# Patient Record
Sex: Female | Born: 1990 | Race: White | Hispanic: No | Smoking: Former smoker
Health system: Southern US, Community
[De-identification: ages and names within clinical notes are randomized; demographics above are authoritative.]

## PROBLEM LIST (undated history)

## (undated) DIAGNOSIS — E039 Hypothyroidism, unspecified: Secondary | ICD-10-CM

## (undated) HISTORY — PX: WISDOM TOOTH EXTRACTION: SHX21

---
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
## 2014-10-06 ENCOUNTER — Ambulatory Visit: Payer: Self-pay | Admitting: Physician Assistant

## 2014-10-06 LAB — TSH: Thyroid Stimulating Horm: 7.88 u[IU]/mL — ABNORMAL HIGH

## 2015-06-20 ENCOUNTER — Ambulatory Visit
Admission: EM | Admit: 2015-06-20 | Discharge: 2015-06-20 | Disposition: A | Discharge status: Home / Self Care | Payer: Self-pay | Attending: Family Medicine | Admitting: Family Medicine

## 2015-06-20 ENCOUNTER — Encounter: Payer: Self-pay | Admitting: Emergency Medicine

## 2015-06-20 DIAGNOSIS — B070 Plantar wart: Secondary | ICD-10-CM

## 2015-06-20 MED ORDER — NAPROXEN 500 MG PO TABS: 500.0000 mg | ORAL_TABLET | Freq: Two times a day (BID) | ORAL | Status: DC

## 2015-06-20 NOTE — Discharge Instructions (Signed)
Plantar Warts Warts are benign (noncancerous) growths of the outer skin layer. They can occur at any time in life but are most common during childhood and the teen years. Warts can occur on many skin surfaces of the body. When they occur on the underside (sole) of your foot they are called plantar warts. They often emerge in groups with several small warts encircling a larger growth. CAUSES  Human papillomavirus (HPV) is the cause of plantar warts. HPV attacks a break in the skin of the foot. Walking barefoot can lead to exposure to the wart virus. Plantar warts tend to develop over areas of pressure such as the heel and ball of the foot. Plantar warts often grow into the deeper layers of skin. They may spread to other areas of the sole but cannot spread to other areas of the body. SYMPTOMS  You may also notice a growth on the undersurface of your foot. The wart may grow directly into the sole of the foot, or rise above the surface of the skin on the sole of the foot, or both. They are most often flat from pressure. Warts generally do not cause itching but may cause pain in the area of the wart when you put weight on your foot. DIAGNOSIS  Diagnosis is made by physical examination. This means your caregiver discovers it while examining your foot.  TREATMENT  There are many ways to treat plantar warts. However, warts are very tough. Sometimes it is difficult to treat them so that they go away completely and do not grow back. Any treatment must be done regularly to work. If left untreated, most plantar warts will eventually disappear over a period of one to two years. Treatments you can do at home include:  Putting duct tape over the top of the wart (occlusion) has been found to be effective over several months. The duct tape should be removed each night and reapplied until the wart has disappeared.  Placing over-the-counter medications on top of the wart to help kill the wart virus and remove the wart  tissue (salicylic acid, cantharidin, and dichloroacetic acid) are useful. These are called keratolytic agents. These medications make the skin soft and gradually layers will shed away. These compounds are usually placed on the wart each night and then covered with a bandage. They are also available in premedicated bandage form. Avoid surrounding skin when applying these liquids as these medications can burn healthy skin. The treatment may take several months of nightly use to be effective.  Cryotherapy to freeze the wart has recently become available over-the-counter for children 4 years and older. This system makes use of a soft narrow applicator connected to a bottle of compressed cold liquid that is applied directly to the wart. This medication can burn healthy skin and should be used with caution.  As with all over-the-counter medications, read the directions carefully before use. Treatments generally done in your caregiver's office include:  Some aggressive treatments may cause discomfort, discoloration, and scarring of the surrounding skin. The risks and benefits of treatment should be discussed with your caregiver.  Freezing the wart with liquid nitrogen (cryotherapy, see above).  Burning the wart with use of very high heat (cautery).  Injecting medication into the wart.  Surgically removing or laser treatment of the wart.  Your caregiver may refer you to a dermatologist for difficult to treat large-sized warts or large numbers of warts. HOME CARE INSTRUCTIONS   Soak the affected area in warm water. Dry the   area completely when you are done. Remove the top layer of softened skin, then apply the chosen topical medication and reapply a bandage.  Remove the bandage daily and file excess wart tissue (pumice stone works well for this purpose). Repeat the entire process daily or every other day for weeks until the plantar wart disappears.  Several brands of salicylic acid pads are available  as over-the-counter remedies.  Pain can be relieved by wearing a donut bandage. This is a bandage with a hole in it. The bandage is put on with the hole over the wart. This helps take the pressure off the wart and gives pain relief. To help prevent plantar warts:  Wear shoes and socks and change them daily.  Keep feet clean and dry.  Check your feet and your children's feet regularly.  Avoid direct contact with warts on other people.  Have growths or changes on your skin checked by your caregiver. Document Released: 02/21/2004 Document Revised: 04/17/2014 Document Reviewed: 08/01/2009 ExitCare Patient Information 2015 [employer]. This information is not intended to replace advice given to you by your health care provider. Make sure you discuss any questions you have with your health care provider.  

## 2015-06-20 NOTE — ED Provider Notes (Signed)
CSN: [id number]     Arrival date & time 06/20/15  1232 History   First MD Initiated Contact with Patient 06/20/15 1347     Chief Complaint  Patient presents with  . Foot Pain   (Consider location/radiation/quality/duration/timing/severity/associated sxs/prior Treatment) HPI is a 24 year old female who presents with left foot pain from a callus type lesion at the base of her left second toe. It is not overlying the metatarsal head. She states has been there for approximate 4 months is not improved. Now it is very painful when  she bears weight. 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 She states that she has no insurance.  History reviewed. No pertinent past medical history.   History reviewed. No pertinent past surgical history. History reviewed. No pertinent family history. History  Substance Use Topics  . Smoking status: Current Every Day Smoker -- 0.50 packs/day    Types: Cigarettes  . Smokeless tobacco: Not on file  . Alcohol Use: Yes   OB History    No data available     Review of Systems  All other systems reviewed and are negative.   Allergies  Review of patient's allergies indicates no known allergies.  Home Medications   Prior to Admission medications   Medication Sig Start Date End Date Taking? Authorizing Provider  naproxen (NAPROSYN) 500 MG tablet Take 1 tablet (500 mg total) by mouth 2 (two) times daily with a meal. 06/20/15   Lutricia FeilWilliam P Roemer, PA-C   BP 121/62 mmHg  Pulse 66  Temp(Src) 98.1 F (36.7 C) (Oral)  Resp 16  SpO2 100%  LMP 05/16/2015 Physical Exam  Constitutional: She is oriented to person, place, and time. She appears well-developed and well-nourished.  HENT:  Head: Normocephalic and atraumatic.  Musculoskeletal:  Examination of her left foot shows a hyperkeratotic round area measuring approximately 1 cm in diameter with a central core that is very painful with any pressure. This is located at the base of the  second toe not on the metatarsal head. It is in the midline of the toe. There is no evidence of infection.  Neurological: She is alert and oriented to person, place, and time.  Skin: Skin is warm and dry.  Psychiatric: She has a normal mood and affect. Her behavior is normal. Judgment and thought content normal.    ED Course  Procedures (including critical care time) Labs Review Labs Reviewed - No data to display  Imaging Review No results found.   MDM   1. Plantar wart of left foot    New Prescriptions   NAPROXEN (NAPROSYN) 500 MG TABLET    Take 1 tablet (500 mg total) by mouth 2 (two) times daily with a meal.  Plan: 1. Diagnosis reviewed with patient 2. rx as per orders; risks, benefits, potential side effects reviewed with patient 3. Recommend supportive treatment with metatarsal pads 4. F/u podiatrist for eval and treatment   Lutricia FeilWilliam P Roemer, PA-C 06/20/15 1431

## 2015-06-20 NOTE — ED Notes (Signed)
Pt states that she has a callus on the bottom of her foot that is causing her pain it has been there for 4 months and she states that it is now getting to where she can barely place pressure or walk on her foot

## 2015-12-09 ENCOUNTER — Emergency Department: Payer: Self-pay

## 2015-12-09 ENCOUNTER — Emergency Department
Admission: EM | Admit: 2015-12-09 | Discharge: 2015-12-09 | Discharge status: Against Medical Advice | Payer: Self-pay | Attending: Emergency Medicine | Admitting: Emergency Medicine

## 2015-12-09 ENCOUNTER — Encounter: Payer: Self-pay | Admitting: Emergency Medicine

## 2015-12-09 DIAGNOSIS — R2242 Localized swelling, mass and lump, left lower limb: Secondary | ICD-10-CM | POA: Insufficient documentation

## 2015-12-09 DIAGNOSIS — M79672 Pain in left foot: Secondary | ICD-10-CM | POA: Insufficient documentation

## 2015-12-09 DIAGNOSIS — F17210 Nicotine dependence, cigarettes, uncomplicated: Secondary | ICD-10-CM | POA: Insufficient documentation

## 2015-12-09 HISTORY — DX: Hypothyroidism, unspecified: E03.9

## 2015-12-09 NOTE — ED Notes (Signed)
Pt presents to ED with left foot swelling and pain that is described as achy. onset approx 4 days ago. Denies any known injury. swelling present with no obvious redness, rash, or bruising. Pt states swelling as increased slightly since onset. Painful when palpating top of foot. Pt is alert and calm at this time and denies any other symptoms. Foot slightly discolored; +cap refill and +pedal pulses.

## 2016-04-29 ENCOUNTER — Encounter: Payer: Self-pay | Admitting: Emergency Medicine

## 2016-04-29 ENCOUNTER — Emergency Department: Payer: Self-pay

## 2016-04-29 ENCOUNTER — Emergency Department
Admission: EM | Admit: 2016-04-29 | Discharge: 2016-04-29 | Disposition: A | Discharge status: Home / Self Care | Payer: Self-pay | Attending: Emergency Medicine | Admitting: Emergency Medicine

## 2016-04-29 DIAGNOSIS — J069 Acute upper respiratory infection, unspecified: Secondary | ICD-10-CM | POA: Insufficient documentation

## 2016-04-29 DIAGNOSIS — F17210 Nicotine dependence, cigarettes, uncomplicated: Secondary | ICD-10-CM | POA: Insufficient documentation

## 2016-04-29 DIAGNOSIS — Z791 Long term (current) use of non-steroidal anti-inflammatories (NSAID): Secondary | ICD-10-CM | POA: Insufficient documentation

## 2016-04-29 DIAGNOSIS — E039 Hypothyroidism, unspecified: Secondary | ICD-10-CM | POA: Insufficient documentation

## 2016-04-29 MED ORDER — GUAIFENESIN-CODEINE 100-10 MG/5ML PO SOLN: 10.0000 mL | ORAL | Status: DC | PRN

## 2016-04-29 MED ORDER — AZITHROMYCIN 250 MG PO TABS: ORAL_TABLET | ORAL | Status: DC

## 2016-04-29 NOTE — ED Notes (Signed)
Pt to ed with c/o cough since feb.  Pt states coughing up brown sputum.   Pt +smoker.  Pt currently living in a tent.

## 2016-04-29 NOTE — ED Provider Notes (Signed)
[facility] Emergency Department Provider Note  ____________________________________________  Time seen: Approximately 11:38 AM  I have reviewed the triage vital signs and the nursing notes.   HISTORY  Chief Complaint Cough    HPI Kristen Knight is a 25 y.o. female who presents for evaluation of coughing since February. Patient states now she is coughing up brown reddish sputum. Patient is positive smoker and lives in a tent. Patient denies any fever chills nausea or vomiting.   Past Medical History  Diagnosis Date  . Hypothyroid     There are no active problems to display for this patient.   History reviewed. No pertinent past surgical history.  Current Outpatient Rx  Name  Route  Sig  Dispense  Refill  . azithromycin (ZITHROMAX Z-PAK) 250 MG tablet      Take 2 tablets (500 mg) on  Day 1,  followed by 1 tablet (250 mg) once daily on Days 2 through 5.   6 each   0   . guaiFENesin-codeine 100-10 MG/5ML syrup   Oral   Take 10 mLs by mouth every 4 (four) hours as needed for cough.   180 mL   0   . naproxen (NAPROSYN) 500 MG tablet   Oral   Take 1 tablet (500 mg total) by mouth 2 (two) times daily with a meal.   60 tablet   0     Allergies Review of patient's allergies indicates no known allergies.  History reviewed. No pertinent family history.  Social History Social History  Substance Use Topics  . Smoking status: Current Every Day Smoker -- 0.50 packs/day    Types: Cigarettes  . Smokeless tobacco: None  . Alcohol Use: Yes    Review of Systems Constitutional: No fever/chills Eyes: No visual changes. ENT: No sore throat. Cardiovascular: Denies chest pain. Respiratory: Denies shortness of breath.Positive for coughing up blood. Gastrointestinal: No abdominal pain.  No nausea, no vomiting.  No diarrhea.  No constipation. Genitourinary: Negative for dysuria. Musculoskeletal: Negative for back pain. Skin: Negative for  rash. Neurological: Negative for headaches, focal weakness or numbness.  10-point ROS otherwise negative.  ____________________________________________   PHYSICAL EXAM:  VITAL SIGNS: ED Triage Vitals  Enc Vitals Group     BP 04/29/16 1111 117/61 mmHg     Pulse Rate 04/29/16 1111 80     Resp 04/29/16 1111 20     Temp 04/29/16 1111 98.2 F (36.8 C)     Temp Source 04/29/16 1111 Oral     SpO2 04/29/16 1111 98 %     Weight 04/29/16 1111 260 lb (117.935 kg)     Height 04/29/16 1111  (1.651 m)     Head Cir --      Peak Flow --      Pain Score 04/29/16 1123 0     Pain Loc --      Pain Edu? --      Excl. in GC? --     Constitutional: Alert and oriented. Well appearing and in no acute distress. Head: Atraumatic. Nose: No congestion/rhinnorhea. Mouth/Throat: Mucous membranes are moist.  Oropharynx non-erythematous. Neck: No stridor.   Cardiovascular: Normal rate, regular rhythm. Grossly normal heart sounds.  Good peripheral circulation. Respiratory: Normal respiratory effort.  No retractions. Lungs CTAB. Musculoskeletal: No lower extremity tenderness nor edema.  No joint effusions. Neurologic:  Normal speech and language. No gross focal neurologic deficits are appreciated. No gait instability. Skin:  Skin is warm, dry and intact. No rash  noted. Psychiatric: Mood and affect are normal. Speech and behavior are normal.  ____________________________________________   LABS (all labs ordered are listed, but only abnormal results are displayed)  Labs Reviewed - No data to display ____________________________________________   RADIOLOGY  No acute cardiopulmonary findings. ____________________________________________   PROCEDURES  Procedure(s) performed: None  Critical Care performed: No  ____________________________________________   INITIAL IMPRESSION / ASSESSMENT AND PLAN / ED COURSE  Pertinent labs & imaging results that were available during my care of the  patient were reviewed by me and considered in my medical decision making (see chart for details).  Acute URI. Rx given for Zithromax and Robitussin with codeine. Patient follow-up PCP or return to ER with any worsening symptomology. Patient voices no other emergency medical complaints at this visit. ____________________________________________   FINAL CLINICAL IMPRESSION(S) / ED DIAGNOSES  Final diagnoses:  URI, acute     This chart was dictated using voice recognition software/Dragon. Despite best efforts to proofread, errors can occur which can change the meaning. Any change was purely unintentional.   Evangeline Dakinharles M Tytus Strahle, PA-C 04/29/16 1506  Sharman CheekPhillip Stafford, MD 04/30/16 [phone]

## 2016-04-29 NOTE — Discharge Instructions (Signed)
Upper Respiratory Infection, Adult Most upper respiratory infections (URIs) are a viral infection of the air passages leading to the lungs. A URI affects the nose, throat, and upper air passages. The most common type of URI is nasopharyngitis and is typically referred to as "the common cold." URIs run their course and usually go away on their own. Most of the time, a URI does not require medical attention, but sometimes a bacterial infection in the upper airways can follow a viral infection. This is called a secondary infection. Sinus and middle ear infections are common types of secondary upper respiratory infections. Bacterial pneumonia can also complicate a URI. A URI can worsen asthma and chronic obstructive pulmonary disease (COPD). Sometimes, these complications can require emergency medical care and may be life threatening.  CAUSES Almost all URIs are caused by viruses. A virus is a type of germ and can spread from one person to another.  RISKS FACTORS You may be at risk for a URI if:   You smoke.   You have chronic heart or lung disease.  You have a weakened defense (immune) system.   You are very young or very old.   You have nasal allergies or asthma.  You work in crowded or poorly ventilated areas.  You work in health care facilities or schools. SIGNS AND SYMPTOMS  Symptoms typically develop 2-3 days after you come in contact with a cold virus. Most viral URIs last 7-10 days. However, viral URIs from the influenza virus (flu virus) can last 14-18 days and are typically more severe. Symptoms may include:   Runny or stuffy (congested) nose.   Sneezing.   Cough.   Sore throat.   Headache.   Fatigue.   Fever.   Loss of appetite.   Pain in your forehead, behind your eyes, and over your cheekbones (sinus pain).  Muscle aches.  DIAGNOSIS  Your health care provider may diagnose a URI by:  Physical exam.  Tests to check that your symptoms are not due to  another condition such as:  Strep throat.  Sinusitis.  Pneumonia.  Asthma. TREATMENT  A URI goes away on its own with time. It cannot be cured with medicines, but medicines may be prescribed or recommended to relieve symptoms. Medicines may help:  Reduce your fever.  Reduce your cough.  Relieve nasal congestion. HOME CARE INSTRUCTIONS   Take medicines only as directed by your health care provider.   Gargle warm saltwater or take cough drops to comfort your throat as directed by your health care provider.  Use a warm mist humidifier or inhale steam from a shower to increase air moisture. This may make it easier to breathe.  Drink enough fluid to keep your urine clear or pale yellow.   Eat soups and other clear broths and maintain good nutrition.   Rest as needed.   Return to work when your temperature has returned to normal or as your health care provider advises. You may need to stay home longer to avoid infecting others. You can also use a face mask and careful hand washing to prevent spread of the virus.  Increase the usage of your inhaler if you have asthma.   Do not use any tobacco products, including cigarettes, chewing tobacco, or electronic cigarettes. If you need help quitting, ask your health care provider. PREVENTION  The best way to protect yourself from getting a cold is to practice good hygiene.   Avoid oral or hand contact with people with cold   symptoms.   Wash your hands often if contact occurs.  There is no clear evidence that vitamin C, vitamin E, echinacea, or exercise reduces the chance of developing a cold. However, it is always recommended to get plenty of rest, exercise, and practice good nutrition.  SEEK MEDICAL CARE IF:   You are getting worse rather than better.   Your symptoms are not controlled by medicine.   You have chills.  You have worsening shortness of breath.  You have brown or red mucus.  You have yellow or brown nasal  discharge.  You have pain in your face, especially when you bend forward.  You have a fever.  You have swollen neck glands.  You have pain while swallowing.  You have white areas in the back of your throat. SEEK IMMEDIATE MEDICAL CARE IF:   You have severe or persistent:  Headache.  Ear pain.  Sinus pain.  Chest pain.  You have chronic lung disease and any of the following:  Wheezing.  Prolonged cough.  Coughing up blood.  A change in your usual mucus.  You have a stiff neck.  You have changes in your:  Vision.  Hearing.  Thinking.  Mood. MAKE SURE YOU:   Understand these instructions.  Will watch your condition.  Will get help right away if you are not doing well or get worse.   This information is not intended to replace advice given to you by your health care provider. Make sure you discuss any questions you have with your health care provider.   Document Released: 05/27/2001 Document Revised: 04/17/2015 Document Reviewed: 03/08/2014 Elsevier Interactive Patient Education 2016 [employer]  

## 2016-07-05 ENCOUNTER — Encounter: Payer: Self-pay | Admitting: Urgent Care

## 2016-07-05 ENCOUNTER — Emergency Department
Admission: EM | Admit: 2016-07-05 | Discharge: 2016-07-05 | Disposition: A | Discharge status: Home / Self Care | Payer: Self-pay | Attending: Emergency Medicine | Admitting: Emergency Medicine

## 2016-07-05 DIAGNOSIS — T673XXA Heat exhaustion, anhydrotic, initial encounter: Secondary | ICD-10-CM | POA: Insufficient documentation

## 2016-07-05 DIAGNOSIS — Z5321 Procedure and treatment not carried out due to patient leaving prior to being seen by health care provider: Secondary | ICD-10-CM | POA: Insufficient documentation

## 2016-07-05 LAB — POCT PREGNANCY, URINE: Preg Test, Ur: NEGATIVE

## 2016-07-05 MED ORDER — PROCHLORPERAZINE EDISYLATE 5 MG/ML IJ SOLN
10.0000 mg | Freq: Once | INTRAMUSCULAR | Status: AC
  Administered 2016-07-05: 10 mg via INTRAVENOUS

## 2016-07-05 MED ORDER — SODIUM CHLORIDE 0.9 % IV BOLUS (SEPSIS)
1000.0000 mL | Freq: Once | INTRAVENOUS | Status: AC
  Administered 2016-07-05: 1000 mL via INTRAVENOUS

## 2016-07-05 NOTE — ED Notes (Signed)
Patient called back to report that she had her RN friend take her IV out. States she left because she got anxious because the "medicine they gave me for my head didn't work." States she is feeling better now. Advised patient if she felt like she needed to come back that we would be glad to see her.

## 2016-07-05 NOTE — ED Notes (Signed)
Ems pt to lobby via wheelchair, was at work, with excess heat , generalized weakness, nausea , VSS 150/80, HR96, RR 20, SATS 97% RA, CBG 81, 18gauge (L) AC with bolus of normal saline, pt appears in no distress

## 2016-07-05 NOTE — ED Notes (Signed)
Pt rang call bell in subwait stating that her IV was finished.  Pt reports anxiety.  Comfort measures done. Pt was apologized to for the long wait.  Nurse Tammy Sours and nurse Judie Grieve notified.

## 2016-07-05 NOTE — ED Notes (Signed)
Patient presents to the ED via EMS from work. Patient reports that she was working at [employer] and there is little to no air conditioning. Patient felt presyncopal due to the heat. (+) non-specific headache reported. EMS arrives and placed PIV; 500cc NS bolus given.

## 2016-07-05 NOTE — ED Notes (Signed)
Goodman,MD consulted. MD made aware of presenting complaints and triage assessment. MD with [facility] for: EKG, urine preg, NS 1000cc bolus, Compazine 10mg  IVP. Orders to be entered and carried by this RN.

## 2016-07-05 NOTE — ED Notes (Signed)
Pt was in the subwait, waiting to be seen, went to place pt in a treatment room, pt was not there, IV bolus was complete. Attempted to contact pt via phone left a message, contacted emergency conatact ( [facility]), requested to have the pt call the ER to assure removal of IV was done prior to LWBS.

## 2016-12-20 ENCOUNTER — Emergency Department
Admission: EM | Admit: 2016-12-20 | Discharge: 2016-12-20 | Disposition: A | Discharge status: Home / Self Care | Payer: Self-pay | Attending: Emergency Medicine | Admitting: Emergency Medicine

## 2016-12-20 ENCOUNTER — Encounter: Payer: Self-pay | Admitting: Emergency Medicine

## 2016-12-20 DIAGNOSIS — F17210 Nicotine dependence, cigarettes, uncomplicated: Secondary | ICD-10-CM | POA: Insufficient documentation

## 2016-12-20 DIAGNOSIS — E039 Hypothyroidism, unspecified: Secondary | ICD-10-CM | POA: Insufficient documentation

## 2016-12-20 DIAGNOSIS — B9789 Other viral agents as the cause of diseases classified elsewhere: Secondary | ICD-10-CM

## 2016-12-20 DIAGNOSIS — J069 Acute upper respiratory infection, unspecified: Secondary | ICD-10-CM | POA: Insufficient documentation

## 2016-12-20 DIAGNOSIS — Z79899 Other long term (current) drug therapy: Secondary | ICD-10-CM | POA: Insufficient documentation

## 2016-12-20 LAB — RAPID INFLUENZA A&B ANTIGENS (ARMC ONLY)
INFLUENZA A (ARMC): NEGATIVE
INFLUENZA B (ARMC): NEGATIVE

## 2016-12-20 MED ORDER — IBUPROFEN 800 MG PO TABS
ORAL_TABLET | ORAL | Status: AC
  Filled 2016-12-20: qty 1

## 2016-12-20 MED ORDER — IBUPROFEN 600 MG PO TABS: 600.0000 mg | ORAL_TABLET | Freq: Three times a day (TID) | ORAL | 0 refills | Status: DC | PRN

## 2016-12-20 MED ORDER — IBUPROFEN 800 MG PO TABS
800.0000 mg | ORAL_TABLET | Freq: Once | ORAL | Status: AC
  Administered 2016-12-20: 800 mg via ORAL

## 2016-12-20 MED ORDER — PSEUDOEPH-BROMPHEN-DM 30-2-10 MG/5ML PO SYRP: 5.0000 mL | ORAL_SOLUTION | Freq: Four times a day (QID) | ORAL | 0 refills | Status: DC | PRN

## 2016-12-20 NOTE — ED Provider Notes (Signed)
[facility] Emergency Department Provider Note   ____________________________________________   First MD Initiated Contact with Patient 12/20/16 1105     (approximate)  I have reviewed the triage vital signs and the nursing notes.   HISTORY  Chief Complaint Cough    HPI Kristen Knight is a 26 y.o. female patient complaining of acute onset of cough and fever yesterday. Patient also complaining of headache and generalized body aches. Patient rates the pain as a 6/10. Patient describes her pain as "achy". No palliative measures taken for this complaint. Patient has not taken a flu shot this season.   Past Medical History:  Diagnosis Date  . Hypothyroid     There are no active problems to display for this patient.   History reviewed. No pertinent surgical history.  Prior to Admission medications   Medication Sig Start Date End Date Taking? Authorizing Provider  azithromycin (ZITHROMAX Z-PAK) 250 MG tablet Take 2 tablets (500 mg) on  Day 1,  followed by 1 tablet (250 mg) once daily on Days 2 through 5. 04/29/16   Evangeline Dakin, PA-C  brompheniramine-pseudoephedrine-DM 30-2-10 MG/5ML syrup Take 5 mLs by mouth 4 (four) times daily as needed. 12/20/16   Joni Reining, PA-C  guaiFENesin-codeine 100-10 MG/5ML syrup Take 10 mLs by mouth every 4 (four) hours as needed for cough. 04/29/16   Charmayne Sheer Beers, PA-C  ibuprofen (ADVIL,MOTRIN) 600 MG tablet Take 1 tablet (600 mg total) by mouth every 8 (eight) hours as needed. 12/20/16   Joni Reining, PA-C  naproxen (NAPROSYN) 500 MG tablet Take 1 tablet (500 mg total) by mouth 2 (two) times daily with a meal. 06/20/15   Lutricia Feil, PA-C    Allergies Patient has no known allergies.  No family history on file.  Social History Social History  Substance Use Topics  . Smoking status: Current Every Day Smoker    Packs/day: 0.50    Types: Cigarettes  . Smokeless tobacco: Not on file  . Alcohol use Yes     Review of Systems Constitutional: No fever/chills Eyes: No visual changes. ENT: No sore throat. Cardiovascular: Denies chest pain. Respiratory: Denies shortness of breath. Gastrointestinal: No abdominal pain.  No nausea, no vomiting.  No diarrhea.  No constipation. Genitourinary: Negative for dysuria. Musculoskeletal: Negative for back pain. Skin: Negative for rash. Neurological: Negative for headaches, focal weakness or numbness.    ____________________________________________   PHYSICAL EXAM:  VITAL SIGNS: ED Triage Vitals  Enc Vitals Group     BP 12/20/16 1015 116/61     Pulse Rate 12/20/16 1015 98     Resp 12/20/16 1015 20     Temp 12/20/16 1015 97.9 F (36.6 C)     Temp Source 12/20/16 1015 Oral     SpO2 12/20/16 1015 98 %     Weight 12/20/16 1016 250 lb (113.4 kg)     Height 12/20/16 1016 5\' 5"  (1.651 m)     Head Circumference --      Peak Flow --      Pain Score 12/20/16 1016 6     Pain Loc --      Pain Edu? --      Excl. in GC? --     Constitutional: Alert and oriented. Well appearing and in no acute distress. Morbid obesity. Eyes: Conjunctivae are normal. PERRL. EOMI. Head: Atraumatic. Nose:Edematous nasal tubes clear rhinorrhea. Mouth/Throat: Mucous membranes are moist.  Oropharynx non-erythematous. Postnasal drainage. Neck: No stridor.  No cervical  spine tenderness to palpation. Hematological/Lymphatic/Immunilogical: No cervical lymphadenopathy. Cardiovascular: Normal rate, regular rhythm. Grossly normal heart sounds.  Good peripheral circulation. Respiratory: Normal respiratory effort.  No retractions. Lungs CTAB. Gastrointestinal: Soft and nontender. No distention. No abdominal bruits. No CVA tenderness. Musculoskeletal: No lower extremity tenderness nor edema.  No joint effusions. Neurologic:  Normal speech and language. No gross focal neurologic deficits are appreciated. No gait instability. Skin:  Skin is warm, dry and intact. No rash  noted. Psychiatric: Mood and affect are normal. Speech and behavior are normal.  ____________________________________________   LABS (all labs ordered are listed, but only abnormal results are displayed)  Labs Reviewed  RAPID INFLUENZA A&B ANTIGENS ([facility] ONLY)   ____________________________________________  EKG   ____________________________________________  RADIOLOGY   ____________________________________________   PROCEDURES  Procedure(s) performed: None  Procedures  Critical Care performed: No  ____________________________________________   INITIAL IMPRESSION / ASSESSMENT AND PLAN / ED COURSE  Pertinent labs & imaging results that were available during my care of the patient were reviewed by me and considered in my medical decision making (see chart for details).  Upper respiratory infection. Patient given discharge Instructions. [facility]. Patient given a work note and advised to [facility]  Clinical Course      ____________________________________________   FINAL CLINICAL IMPRESSION(S) / ED DIAGNOSES  Final diagnoses:  Viral URI with cough      NEW MEDICATIONS STARTED DURING THIS VISIT:  New Prescriptions   BROMPHENIRAMINE-PSEUDOEPHEDRINE-DM 30-2-10 MG/5ML SYRUP    Take 5 mLs by mouth 4 (four) times daily as needed.   IBUPROFEN (ADVIL,MOTRIN) 600 MG TABLET    Take 1 tablet (600 mg total) by mouth every 8 (eight) hours as needed.     Note:  This document was prepared using Dragon voice recognition software and may include unintentional dictation errors.    Joni Reiningonald K Smith, PA-C 12/20/16 1201    Phineas SemenGraydon Goodman, MD 12/20/16 1210

## 2016-12-20 NOTE — ED Triage Notes (Signed)
Cough and fever since yesterday.

## 2017-01-24 ENCOUNTER — Emergency Department
Admission: EM | Admit: 2017-01-24 | Discharge: 2017-01-24 | Disposition: A | Discharge status: Home / Self Care | Payer: Self-pay | Attending: Emergency Medicine | Admitting: Emergency Medicine

## 2017-01-24 ENCOUNTER — Encounter: Payer: Self-pay | Admitting: Emergency Medicine

## 2017-01-24 DIAGNOSIS — E039 Hypothyroidism, unspecified: Secondary | ICD-10-CM | POA: Insufficient documentation

## 2017-01-24 DIAGNOSIS — Z79899 Other long term (current) drug therapy: Secondary | ICD-10-CM | POA: Insufficient documentation

## 2017-01-24 DIAGNOSIS — R519 Headache, unspecified: Secondary | ICD-10-CM

## 2017-01-24 DIAGNOSIS — R51 Headache: Secondary | ICD-10-CM | POA: Insufficient documentation

## 2017-01-24 DIAGNOSIS — F17210 Nicotine dependence, cigarettes, uncomplicated: Secondary | ICD-10-CM | POA: Insufficient documentation

## 2017-01-24 DIAGNOSIS — R55 Syncope and collapse: Secondary | ICD-10-CM | POA: Insufficient documentation

## 2017-01-24 LAB — URINALYSIS, COMPLETE (UACMP) WITH MICROSCOPIC
Bilirubin Urine: NEGATIVE
Glucose, UA: NEGATIVE mg/dL
Ketones, ur: NEGATIVE mg/dL
Leukocytes, UA: NEGATIVE
Nitrite: NEGATIVE
Protein, ur: NEGATIVE mg/dL
Specific Gravity, Urine: 1.024 (ref 1.005–1.030)
pH: 6 (ref 5.0–8.0)

## 2017-01-24 LAB — CBC WITH DIFFERENTIAL/PLATELET
BASOS ABS: 0.1 10*3/uL (ref 0–0.1)
BASOS PCT: 1 %
Eosinophils Absolute: 0.4 10*3/uL (ref 0–0.7)
Eosinophils Relative: 3 %
HEMATOCRIT: 37.4 % (ref 35.0–47.0)
HEMOGLOBIN: 12.7 g/dL (ref 12.0–16.0)
Lymphocytes Relative: 26 %
Lymphs Abs: 3.5 10*3/uL (ref 1.0–3.6)
MCH: 29.4 pg (ref 26.0–34.0)
MCHC: 34 g/dL (ref 32.0–36.0)
MCV: 86.6 fL (ref 80.0–100.0)
Monocytes Absolute: 0.7 10*3/uL (ref 0.2–0.9)
Monocytes Relative: 5 %
NEUTROS ABS: 9 10*3/uL — AB (ref 1.4–6.5)
NEUTROS PCT: 65 %
Platelets: 321 10*3/uL (ref 150–440)
RBC: 4.32 MIL/uL (ref 3.80–5.20)
RDW: 14.5 % (ref 11.5–14.5)
WBC: 13.8 10*3/uL — ABNORMAL HIGH (ref 3.6–11.0)

## 2017-01-24 LAB — T4, FREE: Free T4: 0.61 ng/dL (ref 0.61–1.12)

## 2017-01-24 LAB — PREGNANCY, URINE: Preg Test, Ur: NEGATIVE

## 2017-01-24 LAB — BASIC METABOLIC PANEL
Anion gap: 7 (ref 5–15)
BUN: 12 mg/dL (ref 6–20)
CO2: 29 mmol/L (ref 22–32)
Calcium: 9 mg/dL (ref 8.9–10.3)
Chloride: 102 mmol/L (ref 101–111)
Creatinine, Ser: 0.9 mg/dL (ref 0.44–1.00)
GFR calc Af Amer: >60 mL/min (ref >60)
GFR calc non Af Amer: >60 mL/min (ref >60)
Glucose, Bld: 123 mg/dL — ABNORMAL HIGH (ref 65–99)
Potassium: 3.1 mmol/L — ABNORMAL LOW (ref 3.5–5.1)
Sodium: 138 mmol/L (ref 135–145)

## 2017-01-24 LAB — URINE DRUG SCREEN, QUALITATIVE (ARMC ONLY)
Amphetamines, Ur Screen: NOT DETECTED
BARBITURATES, UR SCREEN: NOT DETECTED
Benzodiazepine, Ur Scrn: POSITIVE — AB
CANNABINOID 50 NG, UR ~~LOC~~: POSITIVE — AB
Cocaine Metabolite,Ur ~~LOC~~: POSITIVE — AB
MDMA (ECSTASY) UR SCREEN: NOT DETECTED
Methadone Scn, Ur: NOT DETECTED
OPIATE, UR SCREEN: NOT DETECTED
PHENCYCLIDINE (PCP) UR S: NOT DETECTED
TRICYCLIC, UR SCREEN: NOT DETECTED

## 2017-01-24 LAB — TSH: TSH: 40.321 u[IU]/mL — AB (ref 0.350–4.500)

## 2017-01-24 MED ORDER — KETOROLAC TROMETHAMINE 30 MG/ML IJ SOLN
30.0000 mg | Freq: Once | INTRAMUSCULAR | Status: AC
  Administered 2017-01-24: 30 mg via INTRAVENOUS
  Filled 2017-01-24: qty 1

## 2017-01-24 MED ORDER — METOCLOPRAMIDE HCL 5 MG/ML IJ SOLN
10.0000 mg | Freq: Once | INTRAMUSCULAR | Status: AC
  Administered 2017-01-24: 10 mg via INTRAVENOUS
  Filled 2017-01-24: qty 2

## 2017-01-24 MED ORDER — SODIUM CHLORIDE 0.9 % IV BOLUS (SEPSIS)
1000.0000 mL | Freq: Once | INTRAVENOUS | Status: AC
  Administered 2017-01-24: 1000 mL via INTRAVENOUS

## 2017-01-24 MED ORDER — DIPHENHYDRAMINE HCL 50 MG/ML IJ SOLN
25.0000 mg | Freq: Once | INTRAMUSCULAR | Status: AC
  Administered 2017-01-24: 25 mg via INTRAVENOUS
  Filled 2017-01-24: qty 1

## 2017-01-24 NOTE — Discharge Instructions (Signed)

## 2017-01-24 NOTE — ED Provider Notes (Signed)
[facility] Emergency Department Provider Note  ____________________________________________  Time seen: Approximately 5:44 AM  I have reviewed the triage vital signs and the nursing notes.   HISTORY  Chief Complaint Migraine   HPI Kristen Knight is a 26 y.o. female a history of migraine headaches and hypothyroidism who presents for evaluation of a headache. Patient reports 3 days of a constant frontal throbbing now severe headache associated with blurry vision and light floaters.  Patient reports his headache is identical to prior migraines that she has had. The reason why she came to the emergency room is because ibuprofen is not helping her and also because she had a syncopal episode earlier today at work. She reports that she went to the bathroom to urinate. As she was buttoning her pants next thing she remembers is being on the floor against a wall. She reports that she heard someone calling her name, open her eyes and she was on the floor of the stall, sitting against the wall. She did not feel dizzy or nauseous, did not have palpitations or chest pain before her syncopal episode. She has never syncopized before. She denies any injuries from the syncopal episode. She denies vomiting or diarrhea. Has been eating and drinking fine, no neck stiffness, no rash no fever. HA started mild and progressed over the course of two days. + photophobia.   Past Medical History:  Diagnosis Date  . Hypothyroid     There are no active problems to display for this patient.   History reviewed. No pertinent surgical history.  Prior to Admission medications   Medication Sig Start Date End Date Taking? Authorizing Provider  azithromycin (ZITHROMAX Z-PAK) 250 MG tablet Take 2 tablets (500 mg) on  Day 1,  followed by 1 tablet (250 mg) once daily on Days 2 through 5. 04/29/16   Evangeline Dakin, PA-C  brompheniramine-pseudoephedrine-DM 30-2-10 MG/5ML syrup Take 5 mLs by mouth 4  (four) times daily as needed. 12/20/16   Joni Reining, PA-C  guaiFENesin-codeine 100-10 MG/5ML syrup Take 10 mLs by mouth every 4 (four) hours as needed for cough. 04/29/16   Charmayne Sheer Beers, PA-C  ibuprofen (ADVIL,MOTRIN) 600 MG tablet Take 1 tablet (600 mg total) by mouth every 8 (eight) hours as needed. 12/20/16   Joni Reining, PA-C  naproxen (NAPROSYN) 500 MG tablet Take 1 tablet (500 mg total) by mouth 2 (two) times daily with a meal. 06/20/15   Lutricia Feil, PA-C    Allergies Patient has no known allergies.  History reviewed. No pertinent family history.  Social History Social History  Substance Use Topics  . Smoking status: Current Every Day Smoker    Packs/day: 0.50    Types: Cigarettes  . Smokeless tobacco: Never Used  . Alcohol use Yes    Review of Systems  Constitutional: Negative for fever. + syncope Eyes: Negative for visual changes. ENT: Negative for sore throat. Neck: No neck pain  Cardiovascular: Negative for chest pain. Respiratory: Negative for shortness of breath. Gastrointestinal: Negative for abdominal pain, vomiting or diarrhea. Genitourinary: Negative for dysuria. Musculoskeletal: Negative for back pain. Skin: Negative for rash. Neurological: Negative for weakness or numbness. + HA Psych: No SI or HI  ____________________________________________   PHYSICAL EXAM:  VITAL SIGNS: ED Triage Vitals  Enc Vitals Group     BP 01/24/17 0441 126/79     Pulse Rate 01/24/17 0441 (!) 103     Resp 01/24/17 0441 14     Temp  01/24/17 0441 98 F (36.7 C)     Temp Source 01/24/17 0441 Oral     SpO2 01/24/17 0441 98 %     Weight 01/24/17 0437 250 lb (113.4 kg)     Height 01/24/17 0437 5\' 5"  (1.651 m)     Head Circumference --      Peak Flow --      Pain Score 01/24/17 0437 9     Pain Loc --      Pain Edu? --      Excl. in GC? --     Constitutional: Alert and oriented. Well appearing and in no apparent distress. HEENT:      Head: Normocephalic and  atraumatic.         Eyes: Conjunctivae are normal. Sclera is non-icteric. EOMI. PERRL      Mouth/Throat: Mucous membranes are moist.       Neck: Supple with no signs of meningismus. Cardiovascular: Regular rate and rhythm. No murmurs, gallops, or rubs. 2+ symmetrical distal pulses are present in all extremities. No JVD. Respiratory: Normal respiratory effort. Lungs are clear to auscultation bilaterally. No wheezes, crackles, or rhonchi.  Gastrointestinal: Soft, non tender, and non distended with positive bowel sounds. No rebound or guarding. Genitourinary: No CVA tenderness. Musculoskeletal: Nontender with normal range of motion in all extremities. No edema, cyanosis, or erythema of extremities. Neurologic: Normal speech and language. A & O x3, PERRL, no nystagmus, CN II-XII intact, motor testing reveals good tone and bulk throughout. There is no evidence of pronator drift or dysmetria. Muscle strength is 5/5 throughout. Deep tendon reflexes are 2+ throughout with downgoing toes. Sensory examination is intact. Gait is normal. Skin: Skin is warm, dry and intact. No rash noted. Psychiatric: Mood and affect are normal. Speech and behavior are normal.  ____________________________________________   LABS (all labs ordered are listed, but only abnormal results are displayed)  Labs Reviewed  TSH - Abnormal; Notable for the following:       Result Value   TSH 40.321 (*)    All other components within normal limits  CBC WITH DIFFERENTIAL/PLATELET - Abnormal; Notable for the following:    WBC 13.8 (*)    Neutro Abs 9.0 (*)    All other components within normal limits  BASIC METABOLIC PANEL - Abnormal; Notable for the following:    Potassium 3.1 (*)    Glucose, Bld 123 (*)    All other components within normal limits  URINALYSIS, COMPLETE (UACMP) WITH MICROSCOPIC - Abnormal; Notable for the following:    Color, Urine YELLOW (*)    APPearance CLEAR (*)    Hgb urine dipstick SMALL (*)     Bacteria, UA RARE (*)    Squamous Epithelial / LPF 0-5 (*)    All other components within normal limits  URINE DRUG SCREEN, QUALITATIVE ([facility] ONLY) - Abnormal; Notable for the following:    Cocaine Metabolite,Ur [place] POSITIVE (*)    Cannabinoid 50 Ng, Ur [place] POSITIVE (*)    Benzodiazepine, Ur Scrn POSITIVE (*)    All other components within normal limits  PREGNANCY, URINE  T4, FREE   ____________________________________________  EKG  Normal sinus rhythm, normal intervals, normal axis, no STE or depressions, no evidence of HOCM, AV block, delta wave, ARVD, prolonged QTc, WPW.   ____________________________________________  RADIOLOGY  none  ____________________________________________   PROCEDURES  Procedure(s) performed: None Procedures Critical Care performed:  None ____________________________________________   INITIAL IMPRESSION / ASSESSMENT AND PLAN / ED COURSE   25  y.o. female a history of migraine headaches and hypothyroidism who presents for evaluation of a headache x 3 days consistent with similar migraine. Patient also with syncopal episode earlier today. Patient is extremely well appearing, sitting up and talking with her girlfriend, neurologically intact, vitals are within normal limits. Physical exam with no acute findings. We'll check basic blood work since patient had a syncopal episode including EKG, CBC, BMP, TSH, UA and urine pregnancy. Watch patient on telemetry. We'll treat her headache with IV Toradol, IV Benadryl, IV Reglan, and IV fluids. No thunderclap headache and no neurological deficits.  Clinical Course as of Jan 25 1419  Sat Jan 24, 2017  0701 Patient sleeping comfortably, HA resolved. Drug screen positive for cocaine, benzos, and MJ. TSH elevated but normal T4. Will dc home.  [CV]    Clinical Course User Index [CV] Nita Sickle, MD    Pertinent labs & imaging results that were available during my care of the patient were reviewed by me and  considered in my medical decision making (see chart for details).    ____________________________________________   FINAL CLINICAL IMPRESSION(S) / ED DIAGNOSES  Final diagnoses:  Acute nonintractable headache, unspecified headache type  Syncope, unspecified syncope type      NEW MEDICATIONS STARTED DURING THIS VISIT:  Discharge Medication List as of 01/24/2017  7:01 AM       Note:  This document was prepared using Dragon voice recognition software and may include unintentional dictation errors.    Nita Sickle, MD 01/25/17 1421

## 2017-01-24 NOTE — ED Notes (Signed)
Pt. States she has had a HA for the past 3 days.  Pt. States OTC medications have not given relief.   Pt. States she had "blacked out" at work tonight.

## 2017-01-24 NOTE — ED Triage Notes (Signed)
Pt ambulatory to triage in NAD, report migraine x 3 days, states blacked out at work, denies any head trauma.  Reports took naproxen at home w/o relief.  Pt NAD at this time.

## 2017-01-31 ENCOUNTER — Emergency Department
Admission: EM | Admit: 2017-01-31 | Discharge: 2017-01-31 | Disposition: A | Discharge status: Home / Self Care | Payer: Self-pay | Attending: Emergency Medicine | Admitting: Emergency Medicine

## 2017-01-31 ENCOUNTER — Encounter: Payer: Self-pay | Admitting: Emergency Medicine

## 2017-01-31 DIAGNOSIS — K5900 Constipation, unspecified: Secondary | ICD-10-CM | POA: Insufficient documentation

## 2017-01-31 DIAGNOSIS — F17210 Nicotine dependence, cigarettes, uncomplicated: Secondary | ICD-10-CM | POA: Insufficient documentation

## 2017-01-31 DIAGNOSIS — Z791 Long term (current) use of non-steroidal anti-inflammatories (NSAID): Secondary | ICD-10-CM | POA: Insufficient documentation

## 2017-01-31 DIAGNOSIS — Z79899 Other long term (current) drug therapy: Secondary | ICD-10-CM | POA: Insufficient documentation

## 2017-01-31 DIAGNOSIS — E039 Hypothyroidism, unspecified: Secondary | ICD-10-CM | POA: Insufficient documentation

## 2017-01-31 MED ORDER — LACTULOSE 10 GM/15ML PO SOLN
30.0000 g | ORAL | Status: AC
  Administered 2017-01-31: 30 g via ORAL
  Filled 2017-01-31: qty 60

## 2017-01-31 MED ORDER — MAGNESIUM CITRATE PO SOLN
1.0000 | Freq: Once | ORAL | Status: AC
  Administered 2017-01-31: 1 via ORAL
  Filled 2017-01-31: qty 296

## 2017-01-31 MED ORDER — DOCUSATE SODIUM 50 MG/5ML PO LIQD
100.0000 mg | ORAL | Status: AC
  Administered 2017-01-31: 100 mg via ORAL
  Filled 2017-01-31 (×2): qty 10

## 2017-01-31 NOTE — ED Notes (Signed)
Consulted with Dr. York CeriseForbach, no orders at this time

## 2017-01-31 NOTE — ED Notes (Signed)
Pt. States constipation for over a week.  Pt. States trying OTC laxatives and stool softeners with no relief.

## 2017-01-31 NOTE — ED Notes (Signed)
Going home with friend.

## 2017-01-31 NOTE — Discharge Instructions (Signed)
You were seen in the emergency department today for constipation.  We recommend that you use one or more of the following over-the-counter medications in the order described: °  °1)  Colace (or Dulcolax) 100 mg:  This is a stool softener, and you may take it once or twice a day as needed. °2)  Senna tablets:  This is a bowel stimulant that will help "push" out your stool. It is the next step to add after you have tried a stool softener. °3)  Miralax (powder):  This medication works by drawing additional fluid into your intestines and helps to flush out your stool.  Mix the powder with water or juice according to label instructions.  It may help if the Colace and Senna are not sufficient, but you must be sure to use the recommended amount of water or juice when you mix up the powder. °Remember that narcotic pain medications are constipating, so avoid them or minimize their use.  Drink plenty of fluids. ° °Please return to the Emergency Department immediately if you develop new or worsening symptoms that concern you, such as (but not limited to) fever > 101 degrees, severe abdominal pain, or persistent vomiting. ° °

## 2017-01-31 NOTE — ED Provider Notes (Signed)
[facility] Emergency Department Provider Note  ____________________________________________   First MD Initiated Contact with Patient 01/31/17 [phone]     (approximate)  I have reviewed the triage vital signs and the nursing notes.   HISTORY  Chief Complaint Constipation    HPI Kristen Knight is a 26 y.o. female with a chronic medical history of hypothyroidism who presents for evaluation of constipation for 1 week.  Gradual onset, severe.  States she has taken multiple OTC medications (stool softeners, etc) without success.  Tried to manually disimpact herself, also unsuccessful.  Reports feeling bloated and occasional abd cramps, but relatively mild.  Denies fever/chills, CP, SOB, N/V, recent URI symptoms, dysuria.    Past Medical History:  Diagnosis Date  . Hypothyroid     There are no active problems to display for this patient.   History reviewed. No pertinent surgical history.  Prior to Admission medications   Medication Sig Start Date End Date Taking? Authorizing Provider  azithromycin (ZITHROMAX Z-PAK) 250 MG tablet Take 2 tablets (500 mg) on  Day 1,  followed by 1 tablet (250 mg) once daily on Days 2 through 5. 04/29/16   Evangeline Dakinharles M Beers, PA-C  brompheniramine-pseudoephedrine-DM 30-2-10 MG/5ML syrup Take 5 mLs by mouth 4 (four) times daily as needed. 12/20/16   Joni Reiningonald K Smith, PA-C  guaiFENesin-codeine 100-10 MG/5ML syrup Take 10 mLs by mouth every 4 (four) hours as needed for cough. 04/29/16   Charmayne Sheerharles M Beers, PA-C  ibuprofen (ADVIL,MOTRIN) 600 MG tablet Take 1 tablet (600 mg total) by mouth every 8 (eight) hours as needed. 12/20/16   Joni Reiningonald K Smith, PA-C  naproxen (NAPROSYN) 500 MG tablet Take 1 tablet (500 mg total) by mouth 2 (two) times daily with a meal. 06/20/15   Lutricia FeilWilliam P Roemer, PA-C    Allergies Patient has no known allergies.  History reviewed. No pertinent family history.  Social History Social History  Substance Use Topics  .  Smoking status: Current Every Day Smoker    Packs/day: 0.50    Types: Cigarettes  . Smokeless tobacco: Never Used  . Alcohol use Yes    Review of Systems Constitutional: No fever/chills Eyes: No visual changes. ENT: No sore throat. Cardiovascular: Denies chest pain. Respiratory: Denies shortness of breath. Gastrointestinal: Constipation x 1 week with occasional abd cramping.  Denies N/V Genitourinary: Negative for dysuria. Musculoskeletal: Negative for back pain. Skin: Negative for rash. Neurological: Negative for headaches, focal weakness or numbness.  10-point ROS otherwise negative.  ____________________________________________   PHYSICAL EXAM:  VITAL SIGNS: ED Triage Vitals [01/31/17 0127]  Enc Vitals Group     BP (!) 151/83     Pulse Rate 89     Resp 16     Temp 97.5 F (36.4 C)     Temp Source Oral     SpO2 100 %     Weight 250 lb (113.4 kg)     Height 5\' 5"  (1.651 m)     Head Circumference      Peak Flow      Pain Score 9     Pain Loc      Pain Edu?      Excl. in GC?     Constitutional: Alert and oriented. Well appearing and in no acute distress. Eyes: Conjunctivae are normal. PERRL. EOMI. Head: Atraumatic. Nose: No congestion/rhinnorhea. Mouth/Throat: Mucous membranes are moist. Neck: No stridor.  No meningeal signs.   Cardiovascular: Normal rate, regular rhythm. Good peripheral circulation. Grossly normal heart sounds.  Respiratory: Normal respiratory effort.  No retractions. Lungs CTAB. Gastrointestinal: Obese. Soft and nontender. No distention.  Musculoskeletal: No lower extremity tenderness nor edema. No gross deformities of extremities. Neurologic:  Normal speech and language. No gross focal neurologic deficits are appreciated.  Skin:  Skin is warm, dry and intact. No rash noted. Psychiatric: Mood and affect are normal. Speech and behavior are normal.  ____________________________________________   LABS (all labs ordered are listed, but only  abnormal results are displayed)  Labs Reviewed - No data to display ____________________________________________  EKG  None - EKG not ordered by ED physician ____________________________________________  RADIOLOGY   No results found.  ____________________________________________   PROCEDURES  Procedure(s) performed:   Procedures   Critical Care performed: No ____________________________________________   INITIAL IMPRESSION / ASSESSMENT AND PLAN / ED COURSE  Pertinent labs & imaging results that were available during my care of the patient were reviewed by me and considered in my medical decision making (see chart for details).  Discussed several treatment options.  We agreed to try lactulose, then advance to enema if needed.  No indication for blood work nor imaging at this time.   Clinical Course as of Jan 31 635  Sat Jan 31, 2017  0431 No BM after lactulose.  Proceeding with enema  [CF]  [phone] Successful large volume BM after enema.  Ready to go home.  Gave usual/customary return precautions.  [CF]    Clinical Course User Index [CF] Loleta Rose, MD    ____________________________________________  FINAL CLINICAL IMPRESSION(S) / ED DIAGNOSES  Final diagnoses:  Constipation, unspecified constipation type     MEDICATIONS GIVEN DURING THIS VISIT:  Medications  lactulose (CHRONULAC) 10 GM/15ML solution 30 g (30 g Oral Given 01/31/17 0344)  docusate (COLACE) 50 MG/5ML liquid 100 mg (100 mg Oral Given 01/31/17 0502)  magnesium citrate solution 1 Bottle (1 Bottle Oral Given 01/31/17 0502)     NEW OUTPATIENT MEDICATIONS STARTED DURING THIS VISIT:  New Prescriptions   No medications on file    Modified Medications   No medications on file    Discontinued Medications   No medications on file     Note:  This document was prepared using Dragon voice recognition software and may include unintentional dictation errors.    Loleta Rose, MD 01/31/17  [phone]

## 2017-01-31 NOTE — ED Notes (Signed)
Pt. States she was successful in large bowel movement.  Pt. States she is feeling better.

## 2017-01-31 NOTE — ED Triage Notes (Signed)
Pt ambulatory to triage in NAD, report constipation x 1 week, states has tried several OTC laxatives w/o relief, reports abd pain.

## 2017-03-12 ENCOUNTER — Encounter: Payer: Self-pay | Admitting: Emergency Medicine

## 2017-03-12 ENCOUNTER — Emergency Department
Admission: EM | Admit: 2017-03-12 | Discharge: 2017-03-12 | Disposition: A | Discharge status: Home / Self Care | Payer: Self-pay | Attending: Emergency Medicine | Admitting: Emergency Medicine

## 2017-03-12 DIAGNOSIS — E039 Hypothyroidism, unspecified: Secondary | ICD-10-CM | POA: Insufficient documentation

## 2017-03-12 DIAGNOSIS — F17210 Nicotine dependence, cigarettes, uncomplicated: Secondary | ICD-10-CM | POA: Insufficient documentation

## 2017-03-12 DIAGNOSIS — R112 Nausea with vomiting, unspecified: Secondary | ICD-10-CM | POA: Insufficient documentation

## 2017-03-12 LAB — URINALYSIS, COMPLETE (UACMP) WITH MICROSCOPIC
BACTERIA UA: NONE SEEN
BILIRUBIN URINE: NEGATIVE
GLUCOSE, UA: NEGATIVE mg/dL
Ketones, ur: NEGATIVE mg/dL
NITRITE: NEGATIVE
PH: 6 (ref 5.0–8.0)
Protein, ur: NEGATIVE mg/dL
SPECIFIC GRAVITY, URINE: 1.023 (ref 1.005–1.030)

## 2017-03-12 LAB — POCT PREGNANCY, URINE: Preg Test, Ur: NEGATIVE

## 2017-03-12 MED ORDER — ONDANSETRON 4 MG PO TBDP
4.0000 mg | ORAL_TABLET | Freq: Once | ORAL | Status: AC
  Administered 2017-03-12: 4 mg via ORAL
  Filled 2017-03-12: qty 1

## 2017-03-12 MED ORDER — PROMETHAZINE HCL 12.5 MG PO TABS: 12.5000 mg | ORAL_TABLET | Freq: Four times a day (QID) | ORAL | 0 refills | Status: DC | PRN

## 2017-03-12 NOTE — Discharge Instructions (Signed)
Follow up with the primary care provider of your choice. Return to the ER for symptoms that change or worsen if you are unable to schedule an appointment.

## 2017-03-12 NOTE — ED Provider Notes (Signed)
[facility] Emergency Department Provider Note  ____________________________________________   First MD Initiated Contact with Patient 03/12/17 1642     (approximate)  I have reviewed the triage vital signs and the nursing notes.   HISTORY  Chief Complaint Emesis   HPI Kristen GobbleKayla Knight is a 26 y.o. female who presents to the emergency department for evaluation of nausea and vomiting today. She states that she has had an upper respiratory tract infection with lots of postnasal drip and believes that triggered her vomiting this morning. She states she vomited very forcefully, but was then able to tolerate some water and lay back down. Since this morning she has not attempted to eat any solid food, but hasn't been able to drink without vomiting. She denies abdominal pain or dysuria. She denies fever. She states that she noticed some red spots on her face after she vomited.   Past Medical History:  Diagnosis Date  . Hypothyroid     There are no active problems to display for this patient.   History reviewed. No pertinent surgical history.  Prior to Admission medications   Medication Sig Start Date End Date Taking? Authorizing Provider  azithromycin (ZITHROMAX Z-PAK) 250 MG tablet Take 2 tablets (500 mg) on  Day 1,  followed by 1 tablet (250 mg) once daily on Days 2 through 5. 04/29/16   Evangeline Dakinharles M Beers, PA-C  brompheniramine-pseudoephedrine-DM 30-2-10 MG/5ML syrup Take 5 mLs by mouth 4 (four) times daily as needed. 12/20/16   Joni Reiningonald K Smith, PA-C  guaiFENesin-codeine 100-10 MG/5ML syrup Take 10 mLs by mouth every 4 (four) hours as needed for cough. 04/29/16   Charmayne Sheerharles M Beers, PA-C  ibuprofen (ADVIL,MOTRIN) 600 MG tablet Take 1 tablet (600 mg total) by mouth every 8 (eight) hours as needed. 12/20/16   Joni Reiningonald K Smith, PA-C  naproxen (NAPROSYN) 500 MG tablet Take 1 tablet (500 mg total) by mouth 2 (two) times daily with a meal. 06/20/15   Lutricia FeilWilliam P Roemer, PA-C    promethazine (PHENERGAN) 12.5 MG tablet Take 1 tablet (12.5 mg total) by mouth every 6 (six) hours as needed for nausea or vomiting. 03/12/17   Chinita Pesterari B Sharonlee Nine, FNP    Allergies Patient has no known allergies.  No family history on file.  Social History Social History  Substance Use Topics  . Smoking status: Current Every Day Smoker    Packs/day: 0.50    Types: Cigarettes  . Smokeless tobacco: Never Used  . Alcohol use Yes    Review of Systems Constitutional: No fever/chills ENT: No sore throat. Positive for post nasal drip. Cardiovascular: Denies chest pain. Respiratory: Denies shortness of breath. Gastrointestinal: No abdominal pain.  No nausea, no vomiting.  No diarrhea.   Genitourinary: Negative for dysuria. Musculoskeletal: Negative for back pain. Skin: Negative for rash. Neurological: Negative for headaches, focal weakness or numbness. ____________________________________________   PHYSICAL EXAM:  VITAL SIGNS: ED Triage Vitals [03/12/17 1607]  Enc Vitals Group     BP 120/63     Pulse Rate 78     Resp 20     Temp 98.1 F (36.7 C)     Temp Source Oral     SpO2 98 %     Weight 250 lb (113.4 kg)     Height      Head Circumference      Peak Flow      Pain Score 5     Pain Loc      Pain Edu?  Excl. in GC?     Constitutional: Alert and oriented. Well appearing and in no acute distress. Eyes: Conjunctivae are normal. EOMI. Nose: No congestion/rhinnorhea. Mouth/Throat: Mucous membranes are moist.  Oropharynx non-erythematous. Neck: No stridor.   Cardiovascular: Normal rate, regular rhythm. Grossly normal heart sounds.  Good peripheral circulation. Respiratory: Normal respiratory effort.  No retractions. Lungs CTAB. Gastrointestinal: Soft and nontender. No distention.   Skin:  Skin is warm, dry and intact. Petechiae scattered over the face Psychiatric: Mood and affect are normal. Speech and behavior are  normal.  ____________________________________________   LABS (all labs ordered are listed, but only abnormal results are displayed)  Labs Reviewed  URINALYSIS, COMPLETE (UACMP) WITH MICROSCOPIC - Abnormal; Notable for the following:       Result Value   Color, Urine YELLOW (*)    APPearance HAZY (*)    Hgb urine dipstick SMALL (*)    Leukocytes, UA TRACE (*)    Squamous Epithelial / LPF 6-30 (*)    All other components within normal limits  POCT PREGNANCY, URINE   ____________________________________________  EKG   ____________________________________________  RADIOLOGY   ____________________________________________   PROCEDURES  Procedure(s) performed: None  Procedures  Critical Care performed: No  ____________________________________________   INITIAL IMPRESSION / ASSESSMENT AND PLAN / ED COURSE  Pertinent labs & imaging results that were available during my care of the patient were reviewed by me and considered in my medical decision making (see chart for details).  26 year old female presenting to the emergency department for evaluation after a single episode of vomiting this morning. 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 While in the emergency department, she was given Zofran and able to tolerate fluids. She will be given a prescription for Phenergan to be used if needed for return of nausea or vomiting. She was also given a note for work to cover her for tonight's shift. She was instructed to follow-up with the primary care provider of her choice for symptoms are not improving over the next few days or return to the emergency department for symptoms that change or worsen if she is unable to schedule an appointment.      ____________________________________________   FINAL CLINICAL IMPRESSION(S) / ED DIAGNOSES  Final diagnoses:  Non-intractable vomiting with nausea,  unspecified vomiting type      NEW MEDICATIONS STARTED DURING THIS VISIT:  Discharge Medication List as of 03/12/2017  5:24 PM    START taking these medications   Details  promethazine (PHENERGAN) 12.5 MG tablet Take 1 tablet (12.5 mg total) by mouth every 6 (six) hours as needed for nausea or vomiting., Starting Thu 03/12/2017, Print         Note:  This document was prepared using Dragon voice recognition software and may include unintentional dictation errors.    Chinita Pester, FNP 03/12/17 1823    Minna Antis, MD 03/12/17 [phone]

## 2017-03-12 NOTE — ED Triage Notes (Addendum)
Pt with 1-2 episodes of vomiting today with some nausea. Pt has had upper resp sx and has had a lot of drainage. VSS, NAD, skin warm and dry.

## 2017-03-12 NOTE — ED Notes (Signed)
See triage note  States she woke up around 7 am with sudden onset of vomiting  States she vomited a large amt ..states she has been able to keep some fluids down since this am  She is afebrile and denies any pain  But does have a slight rash to face

## 2017-03-25 ENCOUNTER — Encounter: Payer: Self-pay | Admitting: Emergency Medicine

## 2017-03-25 ENCOUNTER — Emergency Department: Payer: Self-pay

## 2017-03-25 ENCOUNTER — Emergency Department
Admission: EM | Admit: 2017-03-25 | Discharge: 2017-03-25 | Disposition: A | Discharge status: Home / Self Care | Payer: Self-pay | Attending: Emergency Medicine | Admitting: Emergency Medicine

## 2017-03-25 DIAGNOSIS — S9031XA Contusion of right foot, initial encounter: Secondary | ICD-10-CM | POA: Insufficient documentation

## 2017-03-25 DIAGNOSIS — Y929 Unspecified place or not applicable: Secondary | ICD-10-CM | POA: Insufficient documentation

## 2017-03-25 DIAGNOSIS — Y990 Civilian activity done for income or pay: Secondary | ICD-10-CM | POA: Insufficient documentation

## 2017-03-25 DIAGNOSIS — X501XXA Overexertion from prolonged static or awkward postures, initial encounter: Secondary | ICD-10-CM | POA: Insufficient documentation

## 2017-03-25 DIAGNOSIS — F17210 Nicotine dependence, cigarettes, uncomplicated: Secondary | ICD-10-CM | POA: Insufficient documentation

## 2017-03-25 DIAGNOSIS — M79671 Pain in right foot: Secondary | ICD-10-CM

## 2017-03-25 DIAGNOSIS — Y939 Activity, unspecified: Secondary | ICD-10-CM | POA: Insufficient documentation

## 2017-03-25 NOTE — ED Provider Notes (Signed)
[facility] Emergency Department Provider Note   ____________________________________________   First MD Initiated Contact with Patient 03/25/17 1102     (approximate)  I have reviewed the triage vital signs and the nursing notes.   HISTORY  Chief Complaint Foot Pain    HPI Kristen Knight is a 26 y.o. female is complaining of right foot pain. Patient states that she just recently obtained new work shoes and prior to this did not have any palms with her foot. She states it is only her right foot that hurts and she is not aware of any problems with her foot prior. She stands and walks a lot at work. Patient has taken naproxen 500 mg once without any relief. She rates her pain as a 9/10.   Past Medical History:  Diagnosis Date  . Hypothyroid     There are no active problems to display for this patient.   History reviewed. No pertinent surgical history.  Prior to Admission medications   Medication Sig Start Date End Date Taking? Authorizing Provider  naproxen (NAPROSYN) 500 MG tablet Take 1 tablet (500 mg total) by mouth 2 (two) times daily with a meal. 06/20/15   Lutricia Feil, PA-C    Allergies Patient has no known allergies.  No family history on file.  Social History Social History  Substance Use Topics  . Smoking status: Current Every Day Smoker    Packs/day: 0.50    Types: Cigarettes  . Smokeless tobacco: Never Used  . Alcohol use Yes    Review of Systems Constitutional: No fever/chills Cardiovascular: Denies chest pain. Respiratory: Denies shortness of breath. Musculoskeletal: Positive pain right foot. Skin: Negative for rash. Neurological: Negative for headaches, focal weakness or numbness.  10-point ROS otherwise negative.  ____________________________________________   PHYSICAL EXAM:  VITAL SIGNS: ED Triage Vitals  Enc Vitals Group     BP 03/25/17 1030 (!) 144/77     Pulse Rate 03/25/17 1030 91     Resp 03/25/17  1030 18     Temp 03/25/17 1030 97.5 F (36.4 C)     Temp Source 03/25/17 1030 Oral     SpO2 03/25/17 1030 97 %     Weight 03/25/17 1027 250 lb (113.4 kg)     Height --      Head Circumference --      Peak Flow --      Pain Score 03/25/17 1024 9     Pain Loc --      Pain Edu? --      Excl. in GC? --     Constitutional: Alert and oriented. Well appearing and in no acute distress. Eyes: Conjunctivae are normal. PERRL. EOMI. Head: Atraumatic. Nose: No congestion/rhinnorhea. Neck: No stridor.   Cardiovascular: Normal rate, regular rhythm. Grossly normal heart sounds.  Good peripheral circulation. Respiratory: Normal respiratory effort.  No retractions. Lungs CTAB. Musculoskeletal: On examination of the right foot there is no gross deformity noted. There is some ecchymosis noted on the plantar aspect of the right foot. There is some tenderness on palpation. Skin is intact. Digits move distally without any difficulty. Neurologic:  Normal speech and language. No gross focal neurologic deficits are appreciated. No gait instability. Skin:  Skin is warm, dry and intact. No rash noted. Psychiatric: Mood and affect are normal. Speech and behavior are normal.  ____________________________________________   LABS (all labs ordered are listed, but only abnormal results are displayed)  Labs Reviewed - No data to display  RADIOLOGY  Right foot x-ray per radiologist: IMPRESSION:  There are calcaneal spurs. No fracture or dislocation. No evident  arthropathy.   I, Tommi Rumps, personally viewed and evaluated these images (plain radiographs) as part of my medical decision making, as well as reviewing the written report by the radiologist.  ____________________________________________   PROCEDURES  Procedure(s) performed: None  Procedures  Critical Care performed: No  ____________________________________________   INITIAL IMPRESSION / ASSESSMENT AND PLAN / ED  COURSE  Pertinent labs & imaging results that were available during my care of the patient were reviewed by me and considered in my medical decision making (see chart for details).  Patient was placed in a postop shoe along with an Ace wrap around her right foot. She was given a note to remain off work for the next 2 days. Patient states that she has naproxen 500 mg at home which she will begin taking twice a day with food. She will follow-up with Dr. Alberteen Spindle in podiatry at [facility] if any continued problems.      ____________________________________________   FINAL CLINICAL IMPRESSION(S) / ED DIAGNOSES  Final diagnoses:  Right foot pain      NEW MEDICATIONS STARTED DURING THIS VISIT:  Discharge Medication List as of 03/25/2017 11:58 AM       Note:  This document was prepared using Dragon voice recognition software and may include unintentional dictation errors.    Tommi Rumps, PA-C 03/25/17 1853    Charlynne Pander, MD 03/26/17 [phone]

## 2017-03-25 NOTE — Discharge Instructions (Signed)
Follow-up with [facility] clinic podiatry department if any continued problems with your foot. Dr. Alberteen Spindle is the podiatrist on call today. Take naproxen 500 mg twice a day with food. Wear Ace wrap and wooden shoe for support. Ice and elevate as needed for pain.

## 2017-03-25 NOTE — ED Triage Notes (Signed)
Presents with pain and bruising to right foot unsure of injury  Ambulates with slight limp

## 2019-03-07 IMAGING — CR DG FOOT COMPLETE 3+V*R*
3 series · 3 of 3 positions shown · non-contrast
Comparison: None.

CLINICAL DATA: Pain and bruising

EXAM:
RIGHT FOOT COMPLETE - 3+ VIEW

[foot ap]
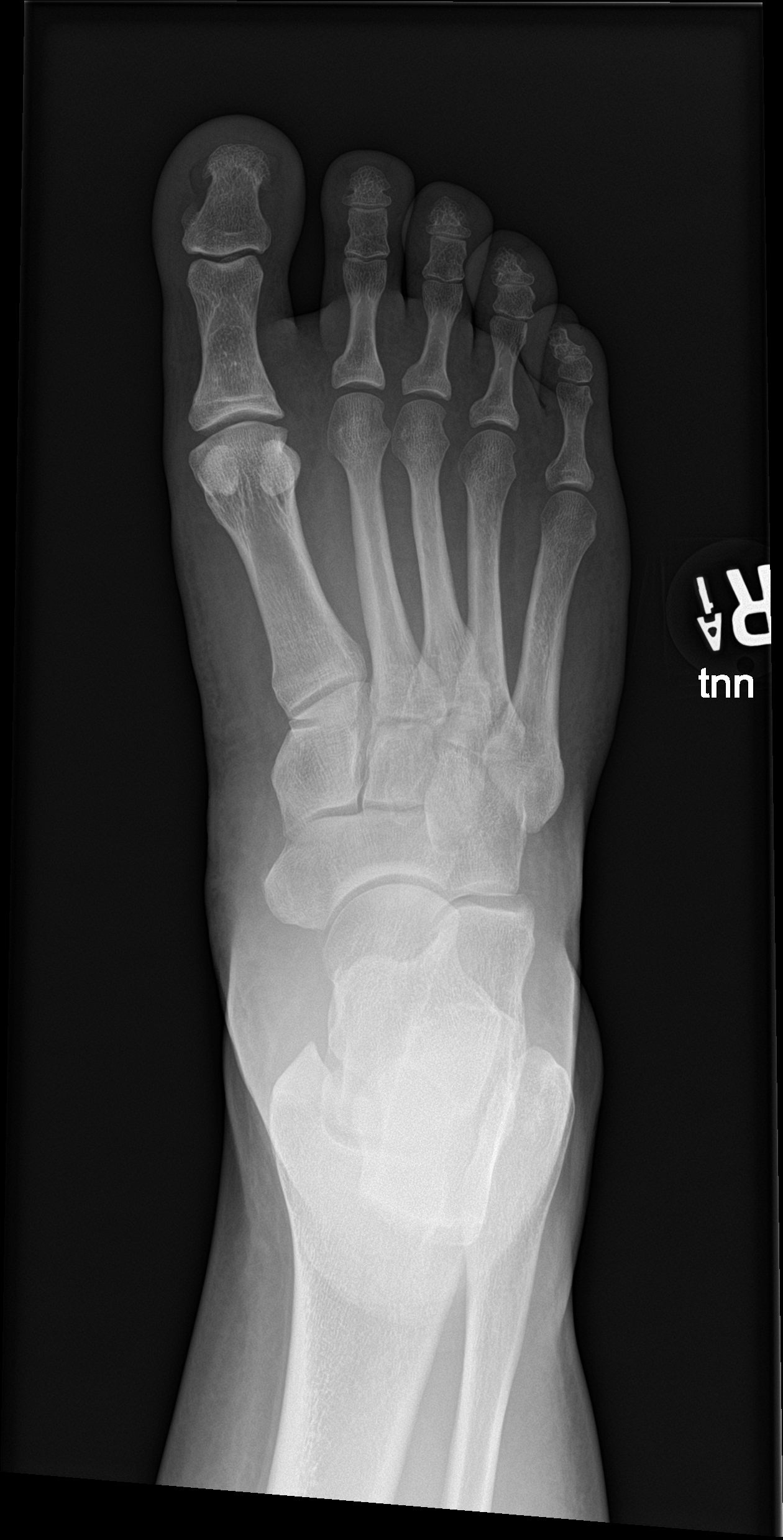

[foot obl]
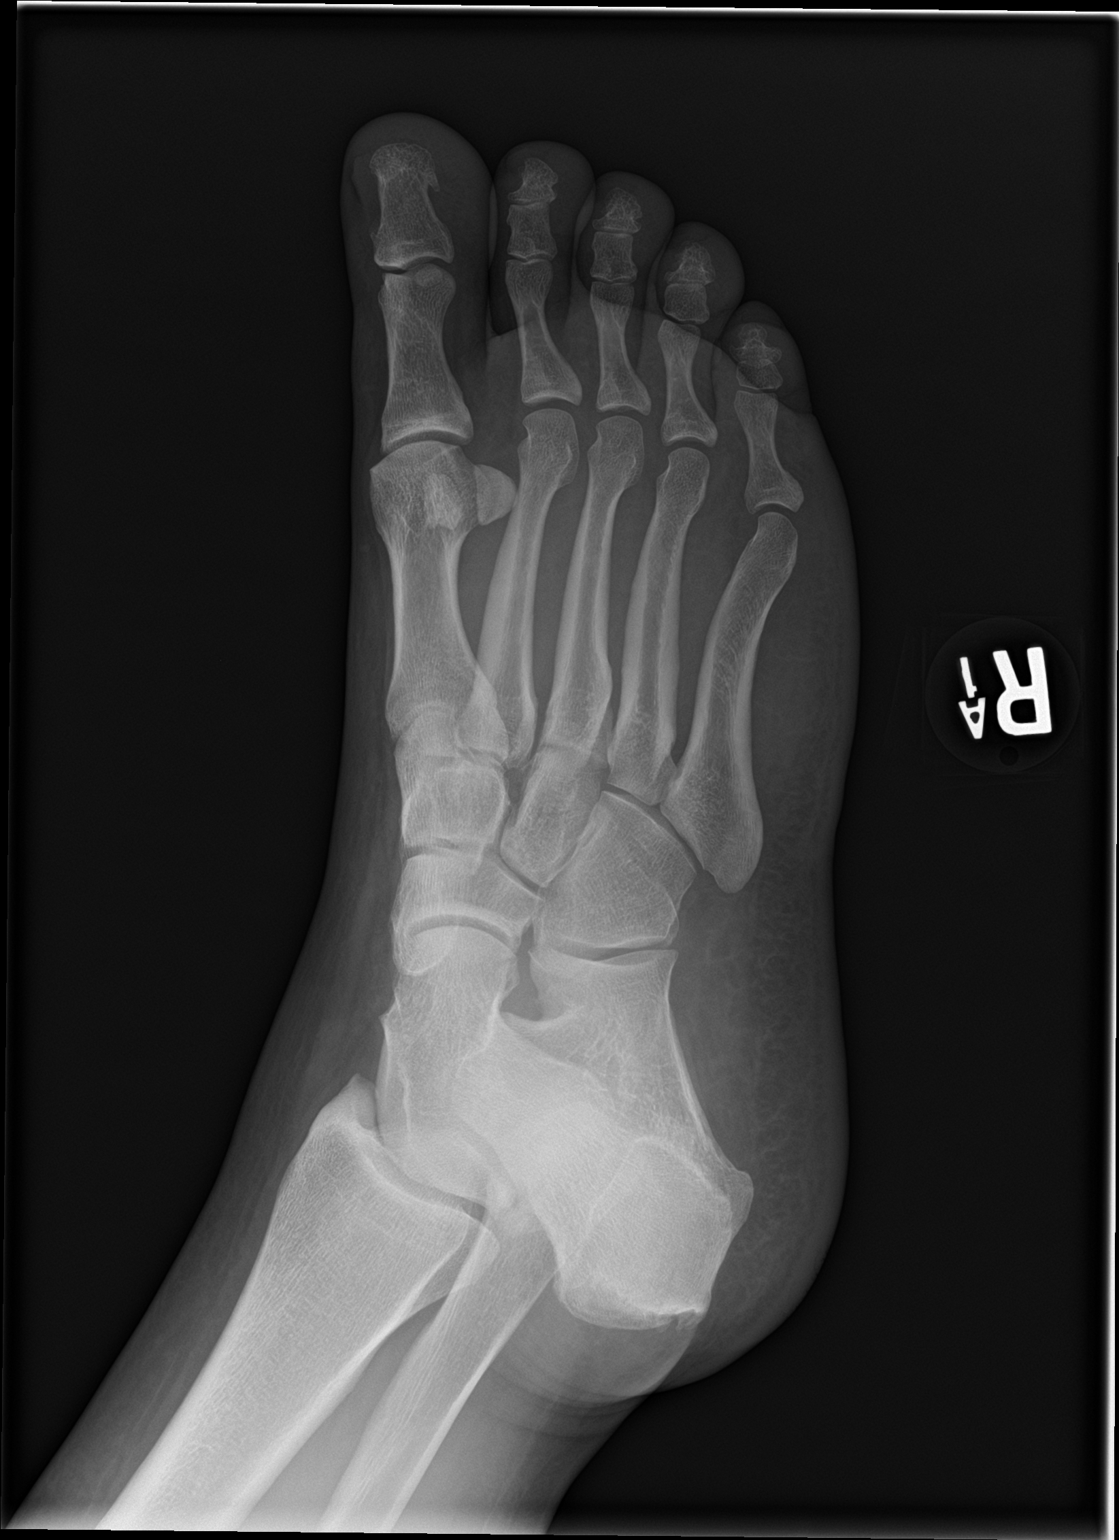

[foot lat]
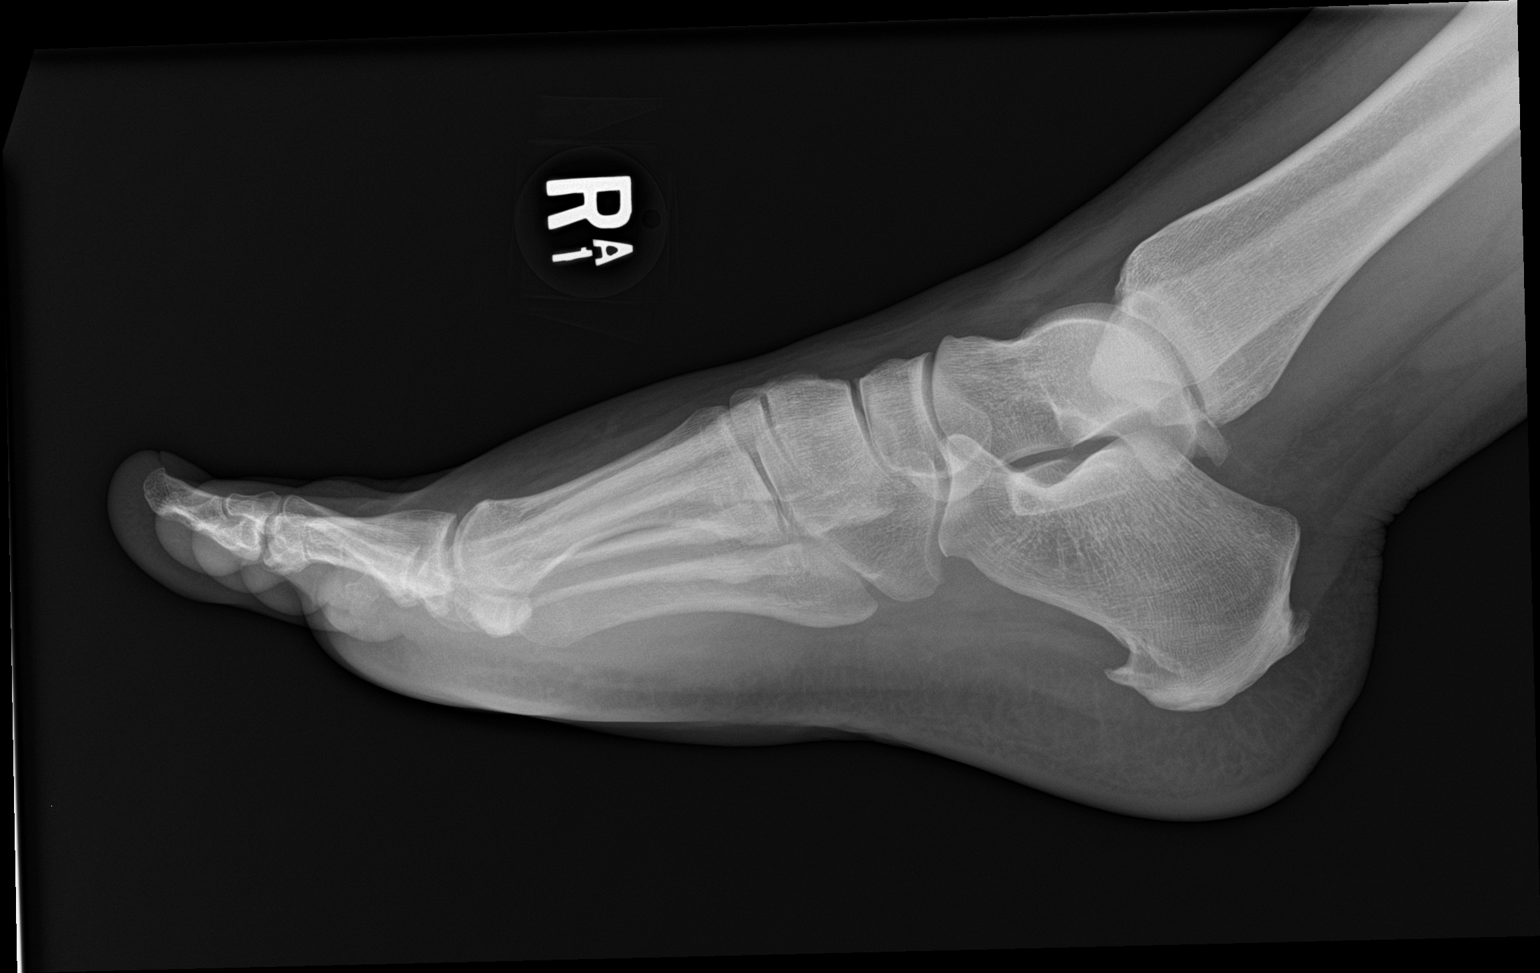

[3 of 3 positions shown; findings below may reference images not displayed]

FINDINGS: Frontal, oblique, and lateral views were obtained. There is no
fracture or dislocation. Joint spaces appear normal. No erosive
change. There are spurs arising from the posterior and inferior
calcaneus.
IMPRESSION: There are calcaneal spurs. No fracture or dislocation. No evident
arthropathy.

## 2020-06-01 ENCOUNTER — Encounter: Payer: Self-pay | Admitting: Family Medicine

## 2020-06-01 ENCOUNTER — Other Ambulatory Visit: Payer: Self-pay

## 2020-06-01 ENCOUNTER — Ambulatory Visit (INDEPENDENT_AMBULATORY_CARE_PROVIDER_SITE_OTHER): Payer: Medicaid Other | Admitting: Family Medicine

## 2020-06-01 VITALS — BP 118/70 | HR 61 | Temp 97.3°F | Ht 64.5 in | Wt 293.1 lb

## 2020-06-01 DIAGNOSIS — Z7689 Persons encountering health services in other specified circumstances: Secondary | ICD-10-CM

## 2020-06-01 DIAGNOSIS — N946 Dysmenorrhea, unspecified: Secondary | ICD-10-CM

## 2020-06-01 DIAGNOSIS — E559 Vitamin D deficiency, unspecified: Secondary | ICD-10-CM

## 2020-06-01 DIAGNOSIS — E039 Hypothyroidism, unspecified: Secondary | ICD-10-CM

## 2020-06-01 LAB — CBC WITH DIFFERENTIAL/PLATELET
Basophils Absolute: 0.1 10*3/uL (ref 0.0–0.1)
Basophils Relative: 0.5 % (ref 0.0–3.0)
Eosinophils Absolute: 0.3 10*3/uL (ref 0.0–0.7)
Eosinophils Relative: 2.6 % (ref 0.0–5.0)
HCT: 36.8 % (ref 36.0–46.0)
Hemoglobin: 12.5 g/dL (ref 12.0–15.0)
Lymphocytes Relative: 29.8 % (ref 12.0–46.0)
Lymphs Abs: 3.8 10*3/uL (ref 0.7–4.0)
MCHC: 33.8 g/dL (ref 30.0–36.0)
MCV: 88 fl (ref 78.0–100.0)
Monocytes Absolute: 0.7 10*3/uL (ref 0.1–1.0)
Monocytes Relative: 5.7 % (ref 3.0–12.0)
Neutro Abs: 7.7 10*3/uL (ref 1.4–7.7)
Neutrophils Relative %: 61.4 % (ref 43.0–77.0)
Platelets: 312 10*3/uL (ref 150.0–400.0)
RBC: 4.19 Mil/uL (ref 3.87–5.11)
RDW: 14.1 % (ref 11.5–15.5)
WBC: 12.6 10*3/uL — ABNORMAL HIGH (ref 4.0–10.5)

## 2020-06-01 LAB — COMPREHENSIVE METABOLIC PANEL
ALT: 22 U/L (ref 0–35)
AST: 24 U/L (ref 0–37)
Albumin: 4.6 g/dL (ref 3.5–5.2)
Alkaline Phosphatase: 47 U/L (ref 39–117)
BUN: 16 mg/dL (ref 6–23)
CO2: 29 mEq/L (ref 19–32)
Calcium: 9.8 mg/dL (ref 8.4–10.5)
Chloride: 102 mEq/L (ref 96–112)
Creatinine, Ser: 0.94 mg/dL (ref 0.40–1.20)
GFR: 71.49 mL/min (ref >60.00)
Glucose, Bld: 83 mg/dL (ref 70–99)
Potassium: 3.5 mEq/L (ref 3.5–5.1)
Sodium: 138 mEq/L (ref 135–145)
Total Bilirubin: 1 mg/dL (ref 0.2–1.2)
Total Protein: 7.9 g/dL (ref 6.0–8.3)

## 2020-06-01 LAB — LIPID PANEL
Cholesterol: 249 mg/dL — ABNORMAL HIGH (ref 0–200)
HDL: 44.2 mg/dL (ref >39.00)
LDL Cholesterol: 177 mg/dL — ABNORMAL HIGH (ref 0–99)
NonHDL: 204.4
Total CHOL/HDL Ratio: 6
Triglycerides: 136 mg/dL (ref 0.0–149.0)
VLDL: 27.2 mg/dL (ref 0.0–40.0)

## 2020-06-01 LAB — HEMOGLOBIN A1C: Hgb A1c MFr Bld: 5 % (ref 4.6–6.5)

## 2020-06-01 LAB — TSH: TSH: 47.86 u[IU]/mL — ABNORMAL HIGH (ref 0.35–4.50)

## 2020-06-01 LAB — VITAMIN D 25 HYDROXY (VIT D DEFICIENCY, FRACTURES): VITD: 14.17 ng/mL — ABNORMAL LOW (ref 30.00–100.00)

## 2020-06-01 MED ORDER — NORGESTIMATE-ETH ESTRADIOL 0.25-35 MG-MCG PO TABS: 1.0000 | ORAL_TABLET | Freq: Every day | ORAL | 4 refills | Status: DC

## 2020-06-01 NOTE — Progress Notes (Signed)
   Subjective:    Patient ID: Kristen Knight, female    DOB: 1991-06-12, 29 y.o.   MRN: [id number]  HPI Chief Complaint  Patient presents with  . New Patient (Initial Visit)    establish care - check Thyroid, refills   This is a 29 yo female who presents today to establish care. Has not had routine care. Lives with girlfriend, enjoys playing video games. Works for [employer], likes her work. Has a cat,    Last CPE- many years ago Pap- many years ago Tdap- 2007, declines today Flu- not ever Covid- has not had Eye- not for many years Dental- not regular Exercise- on her feet, some stretching Diet- eats 1-2 times a day, rice, veggies, chicken, steak. Drinks water, up to 3 canned sodas daily, gatorade  Hypothyroidism- on medication since age 85. Last TSH- pre covid, overdue. Has been on 50 mcg dose for several years.   Periods- irregular, can be long and heavy or light. Has been on OCPs before. Thinks she has PCOS. Has facial hair growth.   Review of Systems No chest pain, no SOB, no abdominal pain, n/v/d, no polyuria/polydipsia/polyphagia. No muscle or joint pain.     Objective:   Physical Exam Physical Exam  Constitutional: Oriented to person, place, and time. She appears well-developed and well-nourished. Obese.  HENT:  Head: Normocephalic and atraumatic.  Eyes: Conjunctivae are normal.  Neck: Normal range of motion. Neck supple.  Cardiovascular: Normal rate, regular rhythm and normal heart sounds.   Pulmonary/Chest: Effort normal and breath sounds normal.  Musculoskeletal: Normal range of motion.  Neurological: Alert and oriented to person, place, and time.  Skin: Skin is warm and dry.  Psychiatric: Normal mood and affect. Behavior is normal. Judgment and thought content normal.  Vitals reviewed.     BP 118/70 (BP Location: Left Arm, Patient Position: Sitting, Cuff Size: Large)   Pulse 61   Temp (!) 97.3 F (36.3 C) (Temporal)   Ht 5' 4.5" (1.638 m)   Wt 293 lb 1.9 oz  (133 kg)   SpO2 100%   BMI 49.54 kg/m  Wt Readings from Last 3 Encounters:  06/01/20 293 lb 1.9 oz (133 kg)  03/25/17 250 lb (113.4 kg)  03/12/17 250 lb (113.4 kg)       Assessment & Plan:  1. Encounter to establish care - reviewed available EMR - recommended Tdap, pap- will schedule today, Covid   2. Hypothyroidism, unspecified type - TSH - CBC with Differential - Comprehensive metabolic panel - Hemoglobin A1c  3. Morbid obesity (HCC) -provided written and verbal information regarding diet - Comprehensive metabolic panel - Hemoglobin A1c - Vitamin D, 25-hydroxy - Lipid Panel  4. Dysmenorrhea - will restart on OCP - TSH - Comprehensive metabolic panel - Hemoglobin A1c  This visit occurred during the SARS-CoV-2 public health emergency.  Safety protocols were in place, including screening questions prior to the visit, additional usage of staff PPE, and extensive cleaning of exam room while observing appropriate contact time as indicated for disinfecting solutions.    Olean Ree, FNP-BC  [facility], [facility]  06/01/2020 10:26 AM

## 2020-06-01 NOTE — Patient Instructions (Addendum)
Good to meet you today  I will notify you of your lab results  Follow up in 6 months for complete physical and pap  A resource that I like is [email or web address].com/diabetes/diet  Youtube channels- Dr. Wylene Simmer, Dr. Shanda Howells  Look for trends with the foods you are eating and be a scientist of your body.  Here are some guidelines to help you with meal planning -  Avoid all processed and packaged foods (bread, pasta, crackers, chips, etc) and beverages containing calories.  Avoid added sugars and excessive natural sugars.  Attention to how you feel if you consume artificial sweeteners.  Do they make you more hungry or raise your blood sugar?  With every meal and snack, aim to get 20 g of protein (3 ounces of meat, 4 ounces of fish, 3 eggs, protein powder, 1 cup [place] yogurt, 1 cup cottage cheese, etc.)  Increase fiber in the form of non-starchy vegetables.  These help you feel full with very little carbohydrates and are good for gut health.  Eat 1 serving healthy carb per meal- 1/2 cup brown rice, beans, potato, corn- pay attention to whether or not this significantly raises your blood sugar. If it does, reduce the frequency you consume these.   Eat 2-3 servings of lower sugar fruits daily.  This includes berries, apples, oranges, peaches, pears, one half banana.  Have small amounts of good fats such as avocado, nuts, olive oil, nut butters, olives.  Add a little cheese to your salads to make them tasty.

## 2020-06-03 MED ORDER — LEVOTHYROXINE SODIUM 75 MCG PO TABS
75.0000 ug | ORAL_TABLET | Freq: Every day | ORAL | 0 refills | Status: DC
Start: 2020-06-03 — End: 2020-10-04

## 2020-06-03 MED ORDER — LEVOTHYROXINE SODIUM 100 MCG PO TABS: 100.0000 ug | ORAL_TABLET | Freq: Every day | ORAL | 0 refills | Status: DC

## 2020-06-03 MED ORDER — VITAMIN D3 1.25 MG (50000 UT) PO TABS: 1.0000 | ORAL_TABLET | ORAL | 3 refills | Status: DC

## 2020-06-03 NOTE — Addendum Note (Signed)
Addended by: Olean Ree B on: 06/03/2020 01:53 PM   Modules accepted: Orders

## 2020-06-03 NOTE — Addendum Note (Signed)
Addended by: Olean Ree B on: 06/03/2020 01:54 PM   Modules accepted: Orders

## 2020-06-20 ENCOUNTER — Telehealth: Payer: Self-pay

## 2020-06-20 NOTE — Telephone Encounter (Signed)
I left a message on patient's voice mail to call back and schedule lab appointment.

## 2020-06-20 NOTE — Telephone Encounter (Signed)
[facility] Night - Client Nonclinical Telephone Record AccessNurse Client [facility] Night - Client Client Site [facility] - Night Physician Deboraha Sprang- NP Contact Type Call Who Is Calling Patient / Member / Family / Caregiver Caller Name Wilda Wetherell Caller Phone Number [phone] Patient Name Kristen Knight Patient DOB April 13, 1991 Call Type Message Only Information Provided Reason for Call Request to Schedule Office Appointment Initial Comment Needs to make appt for blood work, pls call back, office is not answering after opening. Additional Comment Disp. Time Disposition Final User 06/20/2020 8:08:55 AM General Information Provided Yes Salvatore Marvel Call Closed By: Salvatore Marvel Transaction Date/Time: 06/20/2020 8:07:02 AM (ET)

## 2020-06-21 ENCOUNTER — Other Ambulatory Visit: Payer: Self-pay

## 2020-06-21 ENCOUNTER — Other Ambulatory Visit (INDEPENDENT_AMBULATORY_CARE_PROVIDER_SITE_OTHER): Payer: Self-pay

## 2020-06-21 DIAGNOSIS — E039 Hypothyroidism, unspecified: Secondary | ICD-10-CM

## 2020-06-21 LAB — TSH: TSH: 44.53 u[IU]/mL — ABNORMAL HIGH (ref 0.35–4.50)

## 2020-06-29 ENCOUNTER — Encounter: Payer: Self-pay | Admitting: Family Medicine

## 2020-10-04 ENCOUNTER — Encounter: Payer: Self-pay | Admitting: Family Medicine

## 2020-10-04 ENCOUNTER — Other Ambulatory Visit: Payer: Self-pay | Admitting: Family Medicine

## 2020-10-04 DIAGNOSIS — E039 Hypothyroidism, unspecified: Secondary | ICD-10-CM

## 2020-10-04 MED ORDER — LEVOTHYROXINE SODIUM 100 MCG PO TABS
100.0000 ug | ORAL_TABLET | Freq: Every day | ORAL | 0 refills | Status: DC
Start: 2020-10-04 — End: 2020-10-26

## 2020-10-23 ENCOUNTER — Other Ambulatory Visit: Payer: Self-pay | Admitting: Family Medicine

## 2020-10-23 DIAGNOSIS — E039 Hypothyroidism, unspecified: Secondary | ICD-10-CM

## 2020-10-24 ENCOUNTER — Other Ambulatory Visit (INDEPENDENT_AMBULATORY_CARE_PROVIDER_SITE_OTHER): Payer: Self-pay

## 2020-10-24 ENCOUNTER — Other Ambulatory Visit: Payer: Self-pay

## 2020-10-24 DIAGNOSIS — E039 Hypothyroidism, unspecified: Secondary | ICD-10-CM

## 2020-10-24 LAB — TSH: TSH: 33.48 u[IU]/mL — ABNORMAL HIGH (ref 0.35–4.50)

## 2020-10-26 MED ORDER — LEVOTHYROXINE SODIUM 125 MCG PO TABS: 125.0000 ug | ORAL_TABLET | Freq: Every day | ORAL | 0 refills | Status: DC

## 2020-10-26 NOTE — Addendum Note (Signed)
Addended by: Olean Ree B on: 10/26/2020 01:29 PM   Modules accepted: Orders

## 2020-11-30 ENCOUNTER — Encounter: Payer: Medicaid Other | Admitting: Family Medicine

## 2020-12-03 ENCOUNTER — Encounter: Payer: Self-pay | Admitting: Family Medicine

## 2020-12-04 ENCOUNTER — Other Ambulatory Visit: Payer: Self-pay | Admitting: Family Medicine

## 2020-12-04 DIAGNOSIS — E039 Hypothyroidism, unspecified: Secondary | ICD-10-CM

## 2020-12-04 MED ORDER — LEVOTHYROXINE SODIUM 125 MCG PO TABS: 125.0000 ug | ORAL_TABLET | Freq: Every day | ORAL | 0 refills | Status: DC

## 2021-01-07 ENCOUNTER — Telehealth: Payer: Self-pay

## 2021-01-07 DIAGNOSIS — E039 Hypothyroidism, unspecified: Secondary | ICD-10-CM

## 2021-01-07 MED ORDER — LEVOTHYROXINE SODIUM 125 MCG PO TABS: 125.0000 ug | ORAL_TABLET | Freq: Every day | ORAL | 0 refills | Status: DC

## 2021-01-07 NOTE — Addendum Note (Signed)
Addended by: Lorre Munroe on: 01/07/2021 06:54 PM   Modules accepted: Orders

## 2021-01-07 NOTE — Telephone Encounter (Signed)
  LAST APPOINTMENT DATE: 06/01/2020  NEXT APPOINTMENT DATE:@3 /29/2022  MEDICATION: levothyroxine (SYNTHROID) 125 MCG tablet  PHARMACY:[id number] - [place], [place] - [address]  COMMENTS: Patient has at least a weeks worth left.  Please advise

## 2021-01-07 NOTE — Telephone Encounter (Signed)
Refilled x 90 days.

## 2021-03-12 ENCOUNTER — Encounter: Payer: Medicaid Other | Admitting: Internal Medicine

## 2021-05-08 ENCOUNTER — Encounter: Payer: Self-pay | Admitting: Adult Health

## 2021-05-08 ENCOUNTER — Other Ambulatory Visit: Payer: Self-pay

## 2021-05-08 ENCOUNTER — Ambulatory Visit (INDEPENDENT_AMBULATORY_CARE_PROVIDER_SITE_OTHER): Payer: Self-pay | Admitting: Adult Health

## 2021-05-08 VITALS — BP 110/78 | HR 67 | Temp 97.6°F | Ht 64.49 in | Wt 340.2 lb

## 2021-05-08 DIAGNOSIS — E039 Hypothyroidism, unspecified: Secondary | ICD-10-CM

## 2021-05-08 DIAGNOSIS — E785 Hyperlipidemia, unspecified: Secondary | ICD-10-CM

## 2021-05-08 DIAGNOSIS — Z1389 Encounter for screening for other disorder: Secondary | ICD-10-CM

## 2021-05-08 DIAGNOSIS — Z6841 Body Mass Index (BMI) 40.0 and over, adult: Secondary | ICD-10-CM

## 2021-05-08 DIAGNOSIS — E559 Vitamin D deficiency, unspecified: Secondary | ICD-10-CM

## 2021-05-08 DIAGNOSIS — E538 Deficiency of other specified B group vitamins: Secondary | ICD-10-CM

## 2021-05-08 NOTE — Patient Instructions (Signed)
Fat and Cholesterol Restricted Eating Plan Getting too much fat and cholesterol in your diet may cause health problems. Choosing the right foods helps keep your fat and cholesterol at normal levels. This can keep you from getting certain diseases. Your doctor may recommend an eating plan that includes:  Total fat: ______% or less of total calories a day.  Saturated fat: ______% or less of total calories a day.  Cholesterol: less than _________mg a day.  Fiber: ______g a day. What are tips for following this plan? Meal planning  At meals, divide your plate into four equal parts: ? Fill one-half of your plate with vegetables and green salads. ? Fill one-fourth of your plate with whole grains. ? Fill one-fourth of your plate with low-fat (lean) protein foods.  Eat fish that is high in omega-3 fats at least two times a week. This includes mackerel, tuna, sardines, and salmon.  Eat foods that are high in fiber, such as whole grains, beans, apples, broccoli, carrots, peas, and barley. General tips  Work with your doctor to lose weight if you need to.  Avoid: ? Foods with added sugar. ? Fried foods. ? Foods with partially hydrogenated oils.  Limit alcohol intake to no more than 1 drink a day for nonpregnant women and 2 drinks a day for men. One drink equals 12 oz of beer, 5 oz of wine, or 1 oz of hard liquor.   Reading food labels  Check food labels for: ? Trans fats. ? Partially hydrogenated oils. ? Saturated fat (g) in each serving. ? Cholesterol (mg) in each serving. ? Fiber (g) in each serving.  Choose foods with healthy fats, such as: ? Monounsaturated fats. ? Polyunsaturated fats. ? Omega-3 fats.  Choose grain products that have whole grains. Look for the word "whole" as the first word in the ingredient list. Cooking  Cook foods using low-fat methods. These include baking, boiling, grilling, and broiling.  Eat more home-cooked foods. Eat at restaurants and buffets  less often.  Avoid cooking using saturated fats, such as butter, cream, palm oil, palm kernel oil, and coconut oil. Recommended foods Fruits  All fresh, canned (in natural juice), or frozen fruits. Vegetables  Fresh or frozen vegetables (raw, steamed, roasted, or grilled). Green salads. Grains  Whole grains, such as whole wheat or whole grain breads, crackers, cereals, and pasta. Unsweetened oatmeal, bulgur, barley, quinoa, or brown rice. Corn or whole wheat flour tortillas. Meats and other protein foods  Ground beef (85% or leaner), grass-fed beef, or beef trimmed of fat. Skinless chicken or turkey. Ground chicken or turkey. Pork trimmed of fat. All fish and seafood. Egg whites. Dried beans, peas, or lentils. Unsalted nuts or seeds. Unsalted canned beans. Nut butters without added sugar or oil. Dairy  Low-fat or nonfat dairy products, such as skim or 1% milk, 2% or reduced-fat cheeses, low-fat and fat-free ricotta or cottage cheese, or plain low-fat and nonfat yogurt. Fats and oils  Tub margarine without trans fats. Light or reduced-fat mayonnaise and salad dressings. Avocado. Olive, canola, sesame, or safflower oils. The items listed above may not be a complete list of foods and beverages you can eat. Contact a dietitian for more information.   Foods to avoid Fruits  Canned fruit in heavy syrup. Fruit in cream or butter sauce. Fried fruit. Vegetables  Vegetables cooked in cheese, cream, or butter sauce. Fried vegetables. Grains  White bread. White pasta. White rice. Cornbread. Bagels, pastries, and croissants. Crackers and snack foods that contain   trans fat and hydrogenated oils. Meats and other protein foods  Fatty cuts of meat. Ribs, chicken wings, bacon, sausage, bologna, salami, chitterlings, fatback, hot dogs, bratwurst, and packaged lunch meats. Liver and organ meats. Whole eggs and egg yolks. Chicken and turkey with skin. Fried meat. Dairy  Whole or 2% milk, cream,  half-and-half, and cream cheese. Whole milk cheeses. Whole-fat or sweetened yogurt. Full-fat cheeses. Nondairy creamers and whipped toppings. Processed cheese, cheese spreads, and cheese curds. Beverages  Alcohol. Sugar-sweetened drinks such as sodas, lemonade, and fruit drinks. Fats and oils  Butter, stick margarine, lard, shortening, ghee, or bacon fat. Coconut, palm kernel, and palm oils. Sweets and desserts  Corn syrup, sugars, honey, and molasses. Candy. Jam and jelly. Syrup. Sweetened cereals. Cookies, pies, cakes, donuts, muffins, and ice cream. The items listed above may not be a complete list of foods and beverages you should avoid. Contact a dietitian for more information. Summary  Choosing the right foods helps keep your fat and cholesterol at normal levels. This can keep you from getting certain diseases.  At meals, fill one-half of your plate with vegetables and green salads.  Eat high-fiber foods, like whole grains, beans, apples, carrots, peas, and barley.  Limit added sugar, saturated fats, alcohol, and fried foods. This information is not intended to replace advice given to you by your health care provider. Make sure you discuss any questions you have with your health care provider. Document Revised: 04/04/2020 Document Reviewed: 04/04/2020 Elsevier Patient Education  2021 [employer] Inc.  

## 2021-05-08 NOTE — Progress Notes (Signed)
Established Patient Office Visit  Subjective:  Patient ID: Kristen Knight, female    DOB: 10-15-1991  Age: 30 y.o. MRN: [id number]  CC:  Chief Complaint  Patient presents with  . Transitions Of Care    Patient wants to just establish care     HPI Kristen Knight presents for transfer of care.  She is taking Levothroid 125 mcg po qd. Taking with full glass of water.  Has been on thyroid medication since age 89 years old was stable in the past.  She is active with your job- [employer], she walks nature trails.   She has not had irregular cycles since 16, initially had heavy bleeding. She has never had PAP.will schedule.   Patient  denies any fever, body aches,chills, rash, chest pain, shortness of breath, nausea, vomiting, or diarrhea.  Denies dizziness, lightheadedness, pre syncopal or syncopal episodes.   Past Medical History:  Diagnosis Date  . Hypothyroid     Past Surgical History:  Procedure Laterality Date  . WISDOM TOOTH EXTRACTION      Family History  Problem Relation Age of Onset  . Hypothyroidism Mother   . Mental illness Mother   . Alcohol abuse Mother     Social History   Socioeconomic History  . Marital status: Single    Spouse name: Not on file  . Number of children: Not on file  . Years of education: Not on file  . Highest education level: Not on file  Occupational History  . Not on file  Tobacco Use  . Smoking status: Former Smoker    Packs/day: 1.00    Years: 10.00    Pack years: 10.00    Types: Cigarettes, E-cigarettes    Quit date: 06/02/2019    Years since quitting: 1.9  . Smokeless tobacco: Never Used  . Tobacco comment: heaviest 1.5 PPD  Vaping Use  . Vaping Use: Every day  . Start date: 06/02/2019  . Devices: Disposable  Substance and Sexual Activity  . Alcohol use: Never  . Drug use: No  . Sexual activity: Not on file  Other Topics Concern  . Not on file  Social History Narrative  . Not on file   Social Determinants of Health    Financial Resource Strain: Not on file  Food Insecurity: Not on file  Transportation Needs: Not on file  Physical Activity: Not on file  Stress: Not on file  Social Connections: Not on file  Intimate Partner Violence: Not on file    Outpatient Medications Prior to Visit  Medication Sig Dispense Refill  . Cholecalciferol (VITAMIN D3) 1.25 MG (50000 UT) TABS Take 1 tablet by mouth every 7 (seven) days. 12 tablet 3  . levothyroxine (SYNTHROID) 125 MCG tablet Take 1 tablet (125 mcg total) by mouth daily. 90 tablet 0  . naproxen (NAPROSYN) 500 MG tablet Take 1 tablet (500 mg total) by mouth 2 (two) times daily with a meal. (Patient not taking: Reported on 05/08/2021) 60 tablet 0  . norgestimate-ethinyl estradiol (ORTHO-CYCLEN) 0.25-35 MG-MCG tablet Take 1 tablet by mouth daily. (Patient not taking: Reported on 05/08/2021) 84 tablet 4   No facility-administered medications prior to visit.    No Known Allergies  ROS Review of Systems  Constitutional: Negative.   HENT: Negative.   Respiratory: Negative.   Cardiovascular: Negative.   Gastrointestinal: Negative.   Genitourinary: Negative.   Musculoskeletal: Negative.   Neurological: Negative.   Psychiatric/Behavioral: Negative.       Objective:  Physical Exam Vitals reviewed.  Constitutional:      General: She is not in acute distress.    Appearance: She is well-developed. She is obese. She is not diaphoretic.     Interventions: She is not intubated. HENT:     Head: Normocephalic and atraumatic.     Right Ear: Tympanic membrane, ear canal and external ear normal.     Left Ear: External ear normal.     Nose: Nose normal. No congestion or rhinorrhea.     Mouth/Throat:     Mouth: Mucous membranes are moist.     Pharynx: No oropharyngeal exudate.  Eyes:     General: Lids are normal. No scleral icterus.       Right eye: No discharge.        Left eye: No discharge.     Extraocular Movements: Extraocular movements intact.      Conjunctiva/sclera: Conjunctivae normal.     Right eye: Right conjunctiva is not injected. No exudate or hemorrhage.    Left eye: Left conjunctiva is not injected. No exudate or hemorrhage.    Pupils: Pupils are equal, round, and reactive to light.  Neck:     Thyroid: No thyroid mass or thyromegaly.     Vascular: Normal carotid pulses. No carotid bruit, hepatojugular reflux or JVD.     Trachea: Trachea and phonation normal. No tracheal tenderness or tracheal deviation.     Meningeal: Brudzinski's sign and Kernig's sign absent.  Cardiovascular:     Rate and Rhythm: Normal rate and regular rhythm.     Pulses: Normal pulses.          Radial pulses are 2+ on the right side and 2+ on the left side.       Dorsalis pedis pulses are 2+ on the right side and 2+ on the left side.       Posterior tibial pulses are 2+ on the right side and 2+ on the left side.     Heart sounds: Normal heart sounds, S1 normal and S2 normal. Heart sounds not distant. No murmur heard. No friction rub. No gallop.   Pulmonary:     Effort: Pulmonary effort is normal. No tachypnea, bradypnea, accessory muscle usage or respiratory distress. She is not intubated.     Breath sounds: Normal breath sounds. No stridor. No wheezing, rhonchi or rales.  Chest:     Chest wall: No tenderness.  Breasts:     Right: No supraclavicular adenopathy.     Left: No supraclavicular adenopathy.    Abdominal:     General: Bowel sounds are normal. There is no distension or abdominal bruit.     Palpations: Abdomen is soft. There is no shifting dullness, fluid wave, hepatomegaly, splenomegaly, mass or pulsatile mass.     Tenderness: There is no abdominal tenderness. There is no right CVA tenderness, left CVA tenderness, guarding or rebound.     Hernia: No hernia is present.  Musculoskeletal:        General: No tenderness or deformity. Normal range of motion.     Cervical back: Full passive range of motion without pain, normal range of motion  and neck supple. No edema, erythema or rigidity. No spinous process tenderness or muscular tenderness. Normal range of motion.  Lymphadenopathy:     Head:     Right side of head: No submental, submandibular, tonsillar, preauricular, posterior auricular or occipital adenopathy.     Left side of head: No submental, submandibular, tonsillar, preauricular, posterior auricular or  occipital adenopathy.     Cervical: No cervical adenopathy.     Right cervical: No superficial, deep or posterior cervical adenopathy.    Left cervical: No superficial, deep or posterior cervical adenopathy.     Upper Body:     Right upper body: No supraclavicular or pectoral adenopathy.     Left upper body: No supraclavicular or pectoral adenopathy.  Skin:    General: Skin is warm and dry.     Coloration: Skin is not pale.     Findings: No abrasion, bruising, burn, ecchymosis, erythema, lesion, petechiae or rash.     Nails: There is no clubbing.  Neurological:     Mental Status: She is alert and oriented to person, place, and time.     GCS: GCS eye subscore is 4. GCS verbal subscore is 5. GCS motor subscore is 6.     Cranial Nerves: No cranial nerve deficit.     Sensory: No sensory deficit.     Motor: No tremor, atrophy, abnormal muscle tone or seizure activity.     Coordination: Coordination normal.     Gait: Gait normal.     Deep Tendon Reflexes: Reflexes are normal and symmetric.     Reflex Scores:      Tricep reflexes are 2+ on the right side and 2+ on the left side.      Bicep reflexes are 2+ on the right side and 2+ on the left side.      Brachioradialis reflexes are 2+ on the right side and 2+ on the left side.      Patellar reflexes are 2+ on the right side and 2+ on the left side.      Achilles reflexes are 2+ on the right side and 2+ on the left side. Psychiatric:        Speech: Speech normal.        Behavior: Behavior normal.        Thought Content: Thought content normal.        Judgment: Judgment  normal.     BP 110/78 (BP Location: Right Arm, Patient Position: Sitting, Cuff Size: Normal)   Pulse 67   Temp 97.6 F (36.4 C)   Ht 5' 4.49" (1.638 m)   Wt (!) 340 lb 3.2 oz (154.3 kg)   SpO2 98%   BMI 57.51 kg/m  Wt Readings from Last 3 Encounters:  05/08/21 (!) 340 lb 3.2 oz (154.3 kg)  06/01/20 293 lb 1.9 oz (133 kg)  03/25/17 250 lb (113.4 kg)     Health Maintenance Due  Topic Date Due  . TETANUS/TDAP  Never done  . PAP SMEAR-Modifier  Never done    There are no preventive care reminders to display for this patient.  Lab Results  Component Value Date   TSH 33.48 (H) 10/24/2020   Lab Results  Component Value Date   WBC 12.6 (H) 06/01/2020   HGB 12.5 06/01/2020   HCT 36.8 06/01/2020   MCV 88.0 06/01/2020   PLT 312.0 06/01/2020   Lab Results  Component Value Date   NA 138 06/01/2020   K 3.5 06/01/2020   CO2 29 06/01/2020   GLUCOSE 83 06/01/2020   BUN 16 06/01/2020   CREATININE 0.94 06/01/2020   BILITOT 1.0 06/01/2020   ALKPHOS 47 06/01/2020   AST 24 06/01/2020   ALT 22 06/01/2020   PROT 7.9 06/01/2020   ALBUMIN 4.6 06/01/2020   CALCIUM 9.8 06/01/2020   ANIONGAP 7 01/24/2017   GFR 71.49  06/01/2020   Lab Results  Component Value Date   CHOL 249 (H) 06/01/2020   Lab Results  Component Value Date   HDL 44.20 06/01/2020   Lab Results  Component Value Date   LDLCALC 177 (H) 06/01/2020   Lab Results  Component Value Date   TRIG 136.0 06/01/2020   Lab Results  Component Value Date   CHOLHDL 6 06/01/2020   Lab Results  Component Value Date   HGBA1C 5.0 06/01/2020      Assessment & Plan:   Problem List Items Addressed This Visit      Endocrine   Hypothyroidism - Primary   Relevant Orders   TSH   CBC with Differential/Platelet   Comprehensive metabolic panel     Other   Body mass index (BMI) of 50-59.9 in adult (HCC)   Morbid obesity (HCC)   B12 deficiency   Relevant Orders   Hgb A1c w/o eAG   B12 and Folate Panel   Vitamin  D deficiency   Relevant Orders   VITAMIN D 25 Hydroxy (Vit-D Deficiency, Fractures)   VITAMIN D 25 Hydroxy (Vit-D Deficiency, Fractures)   Screening for blood or protein in urine   Relevant Orders   Urinalysis, Routine w reflex microscopic   Hyperlipidemia   Relevant Orders   Lipid panel    she will call to schedulue TDAP .  Needs to schedule PAP smear.  Recommended endocrinology given uncontrolled hypothyroidism. She declines at this time.  Weight loss advised.  No orders of the defined types were placed in this encounter.   Follow-up: Return in about 3 months (around 08/08/2021), or if symptoms worsen or fail to improve, for at any time for any worsening symptoms, Go to Emergency room/ urgent care if worse.    Jairo Ben, FNP

## 2021-05-13 DIAGNOSIS — Z1389 Encounter for screening for other disorder: Secondary | ICD-10-CM | POA: Insufficient documentation

## 2021-05-13 DIAGNOSIS — Z6841 Body Mass Index (BMI) 40.0 and over, adult: Secondary | ICD-10-CM | POA: Insufficient documentation

## 2021-05-13 DIAGNOSIS — E559 Vitamin D deficiency, unspecified: Secondary | ICD-10-CM | POA: Insufficient documentation

## 2021-05-13 DIAGNOSIS — E538 Deficiency of other specified B group vitamins: Secondary | ICD-10-CM | POA: Insufficient documentation

## 2021-05-13 DIAGNOSIS — E785 Hyperlipidemia, unspecified: Secondary | ICD-10-CM | POA: Insufficient documentation

## 2021-05-13 DIAGNOSIS — E039 Hypothyroidism, unspecified: Secondary | ICD-10-CM | POA: Insufficient documentation

## 2021-05-22 ENCOUNTER — Other Ambulatory Visit (INDEPENDENT_AMBULATORY_CARE_PROVIDER_SITE_OTHER): Payer: Medicaid Other

## 2021-05-22 ENCOUNTER — Other Ambulatory Visit: Payer: Self-pay

## 2021-05-22 DIAGNOSIS — E538 Deficiency of other specified B group vitamins: Secondary | ICD-10-CM

## 2021-05-22 DIAGNOSIS — E559 Vitamin D deficiency, unspecified: Secondary | ICD-10-CM

## 2021-05-22 DIAGNOSIS — E039 Hypothyroidism, unspecified: Secondary | ICD-10-CM

## 2021-05-22 DIAGNOSIS — E785 Hyperlipidemia, unspecified: Secondary | ICD-10-CM

## 2021-05-22 DIAGNOSIS — Z1389 Encounter for screening for other disorder: Secondary | ICD-10-CM

## 2021-05-22 LAB — COMPREHENSIVE METABOLIC PANEL
ALT: 55 U/L — ABNORMAL HIGH (ref 0–35)
AST: 46 U/L — ABNORMAL HIGH (ref 0–37)
Albumin: 4.1 g/dL (ref 3.5–5.2)
Alkaline Phosphatase: 44 U/L (ref 39–117)
BUN: 14 mg/dL (ref 6–23)
CO2: 27 mEq/L (ref 19–32)
Calcium: 9.1 mg/dL (ref 8.4–10.5)
Chloride: 101 mEq/L (ref 96–112)
Creatinine, Ser: 0.89 mg/dL (ref 0.40–1.20)
GFR: 88.58 mL/min (ref >60.00)
Glucose, Bld: 84 mg/dL (ref 70–99)
Potassium: 3.4 mEq/L — ABNORMAL LOW (ref 3.5–5.1)
Sodium: 138 mEq/L (ref 135–145)
Total Bilirubin: 0.9 mg/dL (ref 0.2–1.2)
Total Protein: 7.7 g/dL (ref 6.0–8.3)

## 2021-05-22 LAB — LIPID PANEL
Cholesterol: 245 mg/dL — ABNORMAL HIGH (ref 0–200)
HDL: 41.7 mg/dL (ref >39.00)
LDL Cholesterol: 169 mg/dL — ABNORMAL HIGH (ref 0–99)
NonHDL: 203.78
Total CHOL/HDL Ratio: 6
Triglycerides: 175 mg/dL — ABNORMAL HIGH (ref 0.0–149.0)
VLDL: 35 mg/dL (ref 0.0–40.0)

## 2021-05-22 LAB — URINALYSIS, ROUTINE W REFLEX MICROSCOPIC
Bilirubin Urine: NEGATIVE
Ketones, ur: NEGATIVE
Leukocytes,Ua: NEGATIVE
Nitrite: NEGATIVE
Specific Gravity, Urine: >=1.03 — AB (ref 1.000–1.030)
Total Protein, Urine: NEGATIVE
Urine Glucose: NEGATIVE
Urobilinogen, UA: 0.2 (ref 0.0–1.0)
pH: 5 (ref 5.0–8.0)

## 2021-05-22 LAB — CBC WITH DIFFERENTIAL/PLATELET
Basophils Absolute: 0.1 10*3/uL (ref 0.0–0.1)
Basophils Relative: 0.6 % (ref 0.0–3.0)
Eosinophils Absolute: 0.3 10*3/uL (ref 0.0–0.7)
Eosinophils Relative: 2.6 % (ref 0.0–5.0)
HCT: 36.5 % (ref 36.0–46.0)
Hemoglobin: 12.2 g/dL (ref 12.0–15.0)
Lymphocytes Relative: 27.7 % (ref 12.0–46.0)
Lymphs Abs: 3.6 10*3/uL (ref 0.7–4.0)
MCHC: 33.4 g/dL (ref 30.0–36.0)
MCV: 86.1 fl (ref 78.0–100.0)
Monocytes Absolute: 0.6 10*3/uL (ref 0.1–1.0)
Monocytes Relative: 4.4 % (ref 3.0–12.0)
Neutro Abs: 8.4 10*3/uL — ABNORMAL HIGH (ref 1.4–7.7)
Neutrophils Relative %: 64.7 % (ref 43.0–77.0)
Platelets: 278 10*3/uL (ref 150.0–400.0)
RBC: 4.24 Mil/uL (ref 3.87–5.11)
RDW: 14.5 % (ref 11.5–15.5)
WBC: 13 10*3/uL — ABNORMAL HIGH (ref 4.0–10.5)

## 2021-05-22 LAB — VITAMIN D 25 HYDROXY (VIT D DEFICIENCY, FRACTURES): VITD: 16.54 ng/mL — ABNORMAL LOW (ref 30.00–100.00)

## 2021-05-22 LAB — B12 AND FOLATE PANEL
Folate: 11.4 ng/mL (ref >5.9)
Vitamin B-12: 341 pg/mL (ref 211–911)

## 2021-05-22 LAB — TSH: TSH: 33.27 u[IU]/mL — ABNORMAL HIGH (ref 0.35–4.50)

## 2021-05-23 ENCOUNTER — Encounter: Payer: Self-pay | Admitting: Adult Health

## 2021-05-23 ENCOUNTER — Other Ambulatory Visit: Payer: Self-pay | Admitting: Adult Health

## 2021-05-23 DIAGNOSIS — E039 Hypothyroidism, unspecified: Secondary | ICD-10-CM

## 2021-05-23 DIAGNOSIS — E559 Vitamin D deficiency, unspecified: Secondary | ICD-10-CM

## 2021-05-23 DIAGNOSIS — R8290 Unspecified abnormal findings in urine: Secondary | ICD-10-CM

## 2021-05-23 LAB — HGB A1C W/O EAG: Hgb A1c MFr Bld: 5.4 % (ref 4.8–5.6)

## 2021-05-23 MED ORDER — VITAMIN D (ERGOCALCIFEROL) 1.25 MG (50000 UNIT) PO CAPS: 50000.0000 [IU] | ORAL_CAPSULE | ORAL | 0 refills | Status: DC

## 2021-05-23 MED ORDER — LEVOTHYROXINE SODIUM 150 MCG PO TABS: 150.0000 ug | ORAL_TABLET | Freq: Every day | ORAL | 0 refills | Status: DC

## 2021-05-23 NOTE — Progress Notes (Signed)
Medications Discontinued During This Encounter  Medication Reason   levothyroxine (SYNTHROID) 125 MCG tablet Completed Course   Cholecalciferol (VITAMIN D3) 1.25 MG (50000 UT) TABS     Meds ordered this encounter  Medications   Vitamin D, Ergocalciferol, (DRISDOL) 1.25 MG (50000 UNIT) CAPS capsule    Sig: Take 1 capsule (50,000 Units total) by mouth every 7 (seven) days. (taking one tablet per week) schedule lab in 1- 2 weeks after stopping.    Dispense:  12 capsule    Refill:  0   levothyroxine (SYNTHROID) 150 MCG tablet    Sig: Take 1 tablet (150 mcg total) by mouth daily before breakfast.    Dispense:  90 tablet    Refill:  0     Orders Placed This Encounter  Procedures   CULTURE, URINE COMPREHENSIVE    Standing Status:   Future    Standing Expiration Date:   06/22/2021   VITAMIN D 25 Hydroxy (Vit-D Deficiency, Fractures)    Standing Status:   Future    Standing Expiration Date:   09/22/2021   Thyroid Panel With TSH    Standing Status:   Future    Standing Expiration Date:   09/22/2021

## 2021-05-23 NOTE — Progress Notes (Signed)
Hemoglobin A1c is nondiabetic range.  Urinalysis shows the presence of mucus please send for urine culture. Total cholesterol , triglycerides, and LDL elevated.  Discuss lifestyle modification with patient e.g. increase exercise, fiber, fruits, vegetables, lean meat, and omega 3/fish intake and decrease saturated fat.  If patient following strict diet and exercise program already please schedule follow up appointment with primary care physician   CMP shows scantly decreased potassium would incorporate increase potassium foods in the diet such as bananas.  Would not advise any potassium supplements.  Patient's AST and ALT are mildly elevated would recommend a recheck of those in 3 months.  Avoid any excessive Tylenol or alcohol.  Vitamin  D is low, this can contribute to poor sleep and fatigue, will send in prescription for Vitamin D at 50,000 units by mouth once every 7 days/(once weekly) for 12 weeks. Advise recheck lab Vitamin D in 1-2 weeks after completing vitamin d prescription. Labs need to be scheduled.  Meds ordered this encounter   TSH is still out of control, recommend discontinuing Synthroid 125 mcg once daily and starting new dose of Synthroid 150 mcg once daily 2 hours before and after any other medications, supplements, PPI's or dairy.  Would recommend a endocrinologist if patient is willing to see.  Recheck TSH in 3 months with vitamin D.  Medications  Vitamin D, Ergocalciferol, (DRISDOL) 1.25 MG (50000 UNIT) CAPS capsule   Sig: Take 1 capsule (50,000 Units total) by mouth every 7 (seven) days. (taking one tablet per week) schedule lab in 1- 2 weeks after stopping.   Dispense:  12 capsule   Refill:  0  levothyroxine (SYNTHROID) 150 MCG tablet   Sig: Take 1 tablet (150 mcg total) by mouth daily before breakfast.   Dispense:  90 tablet   Refill:  0

## 2021-08-08 ENCOUNTER — Ambulatory Visit: Payer: Medicaid Other | Admitting: Adult Health

## 2021-08-22 ENCOUNTER — Other Ambulatory Visit (INDEPENDENT_AMBULATORY_CARE_PROVIDER_SITE_OTHER): Payer: Medicaid Other

## 2021-08-22 ENCOUNTER — Other Ambulatory Visit: Payer: Self-pay

## 2021-08-22 DIAGNOSIS — E039 Hypothyroidism, unspecified: Secondary | ICD-10-CM

## 2021-08-22 DIAGNOSIS — E559 Vitamin D deficiency, unspecified: Secondary | ICD-10-CM

## 2021-08-22 LAB — VITAMIN D 25 HYDROXY (VIT D DEFICIENCY, FRACTURES): VITD: 15.26 ng/mL — ABNORMAL LOW (ref 30.00–100.00)

## 2021-08-23 ENCOUNTER — Other Ambulatory Visit: Payer: Self-pay | Admitting: Family

## 2021-08-23 DIAGNOSIS — E039 Hypothyroidism, unspecified: Secondary | ICD-10-CM

## 2021-08-23 LAB — THYROID PANEL WITH TSH
Free Thyroxine Index: 1.7 (ref 1.4–3.8)
T3 Uptake: 26 % (ref 22–35)
T4, Total: 6.5 ug/dL (ref 5.1–11.9)
TSH: 21.17 mIU/L — ABNORMAL HIGH

## 2021-08-23 MED ORDER — LEVOTHYROXINE SODIUM 175 MCG PO TABS
175.0000 ug | ORAL_TABLET | Freq: Every day | ORAL | 1 refills | Status: DC
Start: 2021-08-23 — End: 2021-09-24

## 2021-09-10 ENCOUNTER — Ambulatory Visit: Payer: Medicaid Other | Admitting: Adult Health

## 2021-09-17 ENCOUNTER — Other Ambulatory Visit: Payer: Self-pay

## 2021-09-17 ENCOUNTER — Telehealth: Payer: Self-pay | Admitting: Adult Health

## 2021-09-17 DIAGNOSIS — E039 Hypothyroidism, unspecified: Secondary | ICD-10-CM

## 2021-09-18 NOTE — Telephone Encounter (Signed)
Error

## 2021-09-24 ENCOUNTER — Other Ambulatory Visit: Payer: Self-pay

## 2021-09-24 ENCOUNTER — Other Ambulatory Visit (INDEPENDENT_AMBULATORY_CARE_PROVIDER_SITE_OTHER): Payer: Self-pay

## 2021-09-24 ENCOUNTER — Other Ambulatory Visit: Payer: Self-pay | Admitting: Family

## 2021-09-24 DIAGNOSIS — E039 Hypothyroidism, unspecified: Secondary | ICD-10-CM

## 2021-09-24 LAB — TSH: TSH: 10.85 u[IU]/mL — ABNORMAL HIGH (ref 0.35–5.50)

## 2021-09-24 MED ORDER — LEVOTHYROXINE SODIUM 200 MCG PO TABS: 200.0000 ug | ORAL_TABLET | Freq: Every day | ORAL | 2 refills | Status: DC

## 2021-11-06 ENCOUNTER — Telehealth: Payer: Self-pay | Admitting: *Deleted

## 2021-11-06 NOTE — Telephone Encounter (Signed)
Please place future orders for lab appt.  

## 2021-11-10 NOTE — Telephone Encounter (Signed)
Second request, please place future lab orders. Pt has lab appt on Mon 11/28 @ 7:45am

## 2021-11-11 ENCOUNTER — Other Ambulatory Visit: Payer: Self-pay

## 2021-11-11 ENCOUNTER — Other Ambulatory Visit (INDEPENDENT_AMBULATORY_CARE_PROVIDER_SITE_OTHER): Payer: Medicaid Other

## 2021-11-11 ENCOUNTER — Telehealth: Payer: Self-pay

## 2021-11-11 DIAGNOSIS — E039 Hypothyroidism, unspecified: Secondary | ICD-10-CM

## 2021-11-11 LAB — TSH: TSH: 5.86 u[IU]/mL — ABNORMAL HIGH (ref 0.35–5.50)

## 2021-11-11 NOTE — Telephone Encounter (Signed)
FYI- patient came in for labs this morning. No lab orders. Looks like f/u TSH was needed due to changing synthroid so this is what I ordered.

## 2021-11-11 NOTE — Telephone Encounter (Signed)
Original request was sent on 11/23. Kristen Knight is out of the office this week. This has been taken care of now by [place]. Thanks

## 2021-11-12 NOTE — Progress Notes (Signed)
TSH has improved. Lets continue at the same dose of levothyroxine 200 mcg once daily. Recheck TSH in one month at lab. Please place order and schedule.

## 2021-11-13 ENCOUNTER — Telehealth: Payer: Self-pay | Admitting: *Deleted

## 2021-11-13 NOTE — Telephone Encounter (Signed)
-----   Message from Berniece Pap, FNP sent at 11/12/2021  7:44 AM EST ----- TSH has improved. Lets continue at the same dose of levothyroxine 200 mcg once daily. Recheck TSH in one month at lab. Please place order and schedule.

## 2021-11-13 NOTE — Telephone Encounter (Signed)
LMTCB

## 2021-11-26 ENCOUNTER — Telehealth: Payer: Self-pay

## 2021-11-26 DIAGNOSIS — E039 Hypothyroidism, unspecified: Secondary | ICD-10-CM

## 2021-11-26 NOTE — Telephone Encounter (Signed)
Placed orders for TSH for future labs

## 2021-12-18 ENCOUNTER — Other Ambulatory Visit (INDEPENDENT_AMBULATORY_CARE_PROVIDER_SITE_OTHER): Payer: Self-pay

## 2021-12-18 ENCOUNTER — Other Ambulatory Visit: Payer: Self-pay | Admitting: Adult Health

## 2021-12-18 ENCOUNTER — Other Ambulatory Visit: Payer: Self-pay

## 2021-12-18 DIAGNOSIS — E039 Hypothyroidism, unspecified: Secondary | ICD-10-CM

## 2021-12-18 LAB — TSH: TSH: 1.63 u[IU]/mL (ref 0.35–5.50)

## 2021-12-18 MED ORDER — LEVOTHYROXINE SODIUM 175 MCG PO TABS: 175.0000 ug | ORAL_TABLET | Freq: Every day | ORAL | 0 refills | Status: DC

## 2021-12-18 NOTE — Progress Notes (Signed)
Meds ordered this encounter  Medications   levothyroxine (SYNTHROID) 175 MCG tablet    Sig: Take 1 tablet (175 mcg total) by mouth daily.    Dispense:  90 tablet    Refill:  0     Medications Discontinued During This Encounter  Medication Reason   levothyroxine (SYNTHROID) 200 MCG tablet Completed Course

## 2021-12-18 NOTE — Progress Notes (Signed)
Thyroid is now within normal limits decreased from 5.86 one month ago to 1.63 this check. Verify she is taking synthroid 200 mcg once by mouth daily since then and if so I am going to decrease dose and we will recheck TSH in one month. Medication dose below, please add TSH and also schedule office visit one week after lab. She still does not want endocrinology correct ?   Meds ordered this encounter Medications  levothyroxine (SYNTHROID) 175 MCG tablet   Sig: Take 1 tablet (175 mcg total) by mouth daily.   Dispense:  90 tablet   Refill:  0

## 2022-01-06 ENCOUNTER — Ambulatory Visit (INDEPENDENT_AMBULATORY_CARE_PROVIDER_SITE_OTHER): Payer: Self-pay | Admitting: Adult Health

## 2022-01-06 DIAGNOSIS — Z91199 Patient's noncompliance with other medical treatment and regimen due to unspecified reason: Secondary | ICD-10-CM

## 2022-01-06 NOTE — Progress Notes (Signed)
No show for follow up

## 2022-01-20 ENCOUNTER — Encounter: Payer: Self-pay | Admitting: Adult Health

## 2022-01-20 ENCOUNTER — Ambulatory Visit (INDEPENDENT_AMBULATORY_CARE_PROVIDER_SITE_OTHER): Payer: Medicaid Other | Admitting: Adult Health

## 2022-01-20 DIAGNOSIS — Z91199 Patient's noncompliance with other medical treatment and regimen due to unspecified reason: Secondary | ICD-10-CM

## 2022-01-20 NOTE — Progress Notes (Signed)
No show for office visit 7:30 am on 01/20/2022/ request letter.

## 2022-01-27 ENCOUNTER — Encounter: Payer: Self-pay | Admitting: Adult Health

## 2022-01-30 ENCOUNTER — Ambulatory Visit: Payer: Medicaid Other | Admitting: Adult Health

## 2022-04-03 ENCOUNTER — Ambulatory Visit (INDEPENDENT_AMBULATORY_CARE_PROVIDER_SITE_OTHER): Payer: Medicaid Other | Admitting: Family Medicine

## 2022-04-03 ENCOUNTER — Encounter: Payer: Self-pay | Admitting: Family Medicine

## 2022-04-03 VITALS — BP 120/70 | HR 68 | Temp 98.0°F | Ht 64.5 in | Wt 343.3 lb

## 2022-04-03 DIAGNOSIS — Z6841 Body Mass Index (BMI) 40.0 and over, adult: Secondary | ICD-10-CM

## 2022-04-03 DIAGNOSIS — R0683 Snoring: Secondary | ICD-10-CM

## 2022-04-03 DIAGNOSIS — E559 Vitamin D deficiency, unspecified: Secondary | ICD-10-CM

## 2022-04-03 DIAGNOSIS — E039 Hypothyroidism, unspecified: Secondary | ICD-10-CM

## 2022-04-03 DIAGNOSIS — E785 Hyperlipidemia, unspecified: Secondary | ICD-10-CM

## 2022-04-03 LAB — COMPREHENSIVE METABOLIC PANEL
ALT: 66 U/L — ABNORMAL HIGH (ref 0–35)
AST: 49 U/L — ABNORMAL HIGH (ref 0–37)
Albumin: 4.1 g/dL (ref 3.5–5.2)
Alkaline Phosphatase: 56 U/L (ref 39–117)
BUN: 10 mg/dL (ref 6–23)
CO2: 28 mEq/L (ref 19–32)
Calcium: 9 mg/dL (ref 8.4–10.5)
Chloride: 100 mEq/L (ref 96–112)
Creatinine, Ser: 0.69 mg/dL (ref 0.40–1.20)
GFR: 117.85 mL/min (ref >60.00)
Glucose, Bld: 95 mg/dL (ref 70–99)
Potassium: 3.4 mEq/L — ABNORMAL LOW (ref 3.5–5.1)
Sodium: 139 mEq/L (ref 135–145)
Total Bilirubin: 0.8 mg/dL (ref 0.2–1.2)
Total Protein: 7.3 g/dL (ref 6.0–8.3)

## 2022-04-03 LAB — CBC
HCT: 36.5 % (ref 36.0–46.0)
Hemoglobin: 12.2 g/dL (ref 12.0–15.0)
MCHC: 33.6 g/dL (ref 30.0–36.0)
MCV: 86.2 fl (ref 78.0–100.0)
Platelets: 294 10*3/uL (ref 150.0–400.0)
RBC: 4.23 Mil/uL (ref 3.87–5.11)
RDW: 14.7 % (ref 11.5–15.5)
WBC: 12.3 10*3/uL — ABNORMAL HIGH (ref 4.0–10.5)

## 2022-04-03 LAB — LIPID PANEL
Cholesterol: 259 mg/dL — ABNORMAL HIGH (ref 0–200)
HDL: 43.5 mg/dL (ref >39.00)
NonHDL: 215.24
Total CHOL/HDL Ratio: 6
Triglycerides: 213 mg/dL — ABNORMAL HIGH (ref 0.0–149.0)
VLDL: 42.6 mg/dL — ABNORMAL HIGH (ref 0.0–40.0)

## 2022-04-03 LAB — LDL CHOLESTEROL, DIRECT: Direct LDL: 201 mg/dL

## 2022-04-03 LAB — HEMOGLOBIN A1C: Hgb A1c MFr Bld: 5.5 % (ref 4.6–6.5)

## 2022-04-03 LAB — VITAMIN D 25 HYDROXY (VIT D DEFICIENCY, FRACTURES): VITD: 15.54 ng/mL — ABNORMAL LOW (ref 30.00–100.00)

## 2022-04-03 LAB — TSH: TSH: 41.86 u[IU]/mL — ABNORMAL HIGH (ref 0.35–5.50)

## 2022-04-03 NOTE — Assessment & Plan Note (Signed)
Epworth score of 22.  Patient with chronic fatigue despite controlled thyroid.  As well as morbid obesity.  Highly concerning for possible sleep apnea referral to pulm today ?

## 2022-04-03 NOTE — Progress Notes (Signed)
? ?Subjective:  ? ?  ?Kristen Knight is a 31 y.o. female presenting for Transitions Of Care ?  ? ? ?HPI ? ?#hypothyroidism ?- on synthroid ?- went 5 years w/o medication once ?- has been back on meds for 6 years and feels she doing ok ?- always tired ?- struggles with weight loss ?- feels like she does not eat much and doesn't lose weight ?- walks a lot at work ? ?Does not have good relationship with food - will occasionally not eat at all and then will eat a lot of one thing ? ?Snores ?Stops breathing ?Wakes up tired ? ? ?Review of Systems ? ? ?Social History  ? ?Tobacco Use  ?Smoking Status Former  ? Packs/day: 1.00  ? Years: 10.00  ? Pack years: 10.00  ? Types: Cigarettes, E-cigarettes  ? Quit date: 06/02/2019  ? Years since quitting: 2.8  ?Smokeless Tobacco Never  ?Tobacco Comments  ? heaviest 1.5 PPD  ? ? ? ?   ?Objective:  ?  ?BP Readings from Last 3 Encounters:  ?04/03/22 120/70  ?05/08/21 110/78  ?06/01/20 118/70  ? ?Wt Readings from Last 3 Encounters:  ?04/03/22 (!) 343 lb 5 oz (155.7 kg)  ?05/08/21 (!) 340 lb 3.2 oz (154.3 kg)  ?06/01/20 293 lb 1.9 oz (133 kg)  ? ? ?BP 120/70   Pulse 68   Temp 98 ?F (36.7 ?C) (Oral)   Ht 5' 4.5" (1.638 m)   Wt (!) 343 lb 5 oz (155.7 kg)   SpO2 98%   BMI 58.02 kg/m?  ? ? ?Physical Exam ?Constitutional:   ?   General: She is not in acute distress. ?   Appearance: She is well-developed. She is obese. She is not diaphoretic.  ?HENT:  ?   Right Ear: External ear normal.  ?   Left Ear: External ear normal.  ?   Nose: Nose normal.  ?Eyes:  ?   Conjunctiva/sclera: Conjunctivae normal.  ?Cardiovascular:  ?   Rate and Rhythm: Normal rate and regular rhythm.  ?   Heart sounds: No murmur heard. ?Pulmonary:  ?   Effort: Pulmonary effort is normal. No respiratory distress.  ?   Breath sounds: Normal breath sounds. No wheezing.  ?Musculoskeletal:  ?   Cervical back: Neck supple.  ?Skin: ?   General: Skin is warm and dry.  ?   Capillary Refill: Capillary refill takes less than 2  seconds.  ?Neurological:  ?   Mental Status: She is alert. Mental status is at baseline.  ?Psychiatric:     ?   Mood and Affect: Mood normal.     ?   Behavior: Behavior normal.  ? ? ? ? ? ?   ?Assessment & Plan:  ? ?Problem List Items Addressed This Visit   ? ?  ? Endocrine  ? Hypothyroidism - Primary  ?  Stable, however with persistent fatigue symptoms.  Continue 175 mcg. ? ?  ?  ? Relevant Orders  ? TSH  ?  ? Other  ? Body mass index (BMI) of 50-59.9 in adult [facility])  ? Morbid obesity (HCC)  ?  Complicated by hyperlipidemia.  Discussed options including referral to therapy for emotional eating, nutritionist for diet support, and medication assistance.  Encouraged healthy diet and regular exercise.  She will consider options we can discuss further at follow-up repeat labs today. ? ?  ?  ? Relevant Orders  ? CBC  ? Hemoglobin A1c  ? Ambulatory referral to Pulmonology  ?  Vitamin D deficiency  ?  Not been taking a daily supplement, recheck vitamin D today.  Replace as needed and then advise daily supplement ? ?  ?  ? Relevant Orders  ? Vitamin D, 25-hydroxy  ? Hyperlipidemia  ?  Repeat labs today, encouraged healthy diet. ? ?  ?  ? Relevant Orders  ? Comprehensive metabolic panel  ? Lipid panel  ? Snoring  ?  Epworth score of 22.  Patient with chronic fatigue despite controlled thyroid.  As well as morbid obesity.  Highly concerning for possible sleep apnea referral to pulm today ? ?  ?  ? Relevant Orders  ? Ambulatory referral to Pulmonology  ? ? ? ?Return in about 3 months (around 07/03/2022) for weight. ? ?Lynnda Child, MD ? ? ? ?

## 2022-04-03 NOTE — Assessment & Plan Note (Signed)
Repeat labs today, encouraged healthy diet. ?

## 2022-04-03 NOTE — Assessment & Plan Note (Signed)
Not been taking a daily supplement, recheck vitamin D today.  Replace as needed and then advise daily supplement ?

## 2022-04-03 NOTE — Assessment & Plan Note (Signed)
Stable, however with persistent fatigue symptoms.  Continue 175 mcg. ?

## 2022-04-03 NOTE — Assessment & Plan Note (Signed)
Complicated by hyperlipidemia.  Discussed options including referral to therapy for emotional eating, nutritionist for diet support, and medication assistance.  Encouraged healthy diet and regular exercise.  She will consider options we can discuss further at follow-up repeat labs today. ?

## 2022-04-03 NOTE — Patient Instructions (Signed)
I am concerned you have sleep apnea ?- referral to sleep specialist ? ?Weight ?- consider therapy to help process emotional eating ?- consider nutritionist ?- check with insurance about weight loss medication options ?

## 2022-04-04 ENCOUNTER — Other Ambulatory Visit: Payer: Self-pay | Admitting: Family Medicine

## 2022-04-04 ENCOUNTER — Encounter: Payer: Self-pay | Admitting: Family Medicine

## 2022-04-04 DIAGNOSIS — E039 Hypothyroidism, unspecified: Secondary | ICD-10-CM

## 2022-04-04 DIAGNOSIS — E559 Vitamin D deficiency, unspecified: Secondary | ICD-10-CM

## 2022-04-04 MED ORDER — VITAMIN D (ERGOCALCIFEROL) 1.25 MG (50000 UNIT) PO CAPS: 50000.0000 [IU] | ORAL_CAPSULE | ORAL | 1 refills | Status: DC

## 2022-04-07 MED ORDER — LEVOTHYROXINE SODIUM 200 MCG PO TABS: 200.0000 ug | ORAL_TABLET | Freq: Every day | ORAL | 0 refills | Status: DC

## 2022-07-10 ENCOUNTER — Other Ambulatory Visit: Payer: Self-pay | Admitting: Family Medicine

## 2022-07-10 ENCOUNTER — Ambulatory Visit (INDEPENDENT_AMBULATORY_CARE_PROVIDER_SITE_OTHER): Payer: Self-pay | Admitting: Family Medicine

## 2022-07-10 VITALS — BP 122/78 | HR 80 | Temp 97.8°F | Ht 64.5 in | Wt 357.0 lb

## 2022-07-10 DIAGNOSIS — E039 Hypothyroidism, unspecified: Secondary | ICD-10-CM

## 2022-07-10 LAB — TSH: TSH: 8.5 u[IU]/mL — ABNORMAL HIGH (ref 0.35–5.50)

## 2022-07-10 MED ORDER — LEVOTHYROXINE SODIUM 25 MCG PO TABS: 25.0000 ug | ORAL_TABLET | Freq: Every day | ORAL | 3 refills | Status: DC

## 2022-07-10 NOTE — Assessment & Plan Note (Signed)
She missed a recheck, she has been taking her medication as directed.  Recheck thyroid today, if still elevated will plan to increase dose of levothyroxine

## 2022-07-10 NOTE — Patient Instructions (Addendum)
Recheck thyroid today  Continue healthy diet and exercise

## 2022-07-10 NOTE — Assessment & Plan Note (Signed)
Gained weight.  Patient is interested in lifestyle change, she is started exercising and is working on [employer].  Discussed if she has trouble losing weight would recommend that she consider healthy weight and wellness, medication or even bariatric consult.

## 2022-07-10 NOTE — Progress Notes (Signed)
Subjective:     Kristen Knight is a 31 y.o. female presenting for Follow-up (Weight and fatigue. Pt changed work shifts to 12a-9a. /Pt never made appt with pulm. States that she didn't get their call. )     HPI  #Weight  - trying to exercise more - new shift at work is active, going to the park and walking a few laps - spends about 1 hour when she goes - diet has been ok  - trying to eat healthy - will occasionally fall back into bad habits - meat and greens mostly   #hypothyroidism - increased her medication - mood has been more stable - not as sporadic - sleep has been ok - sleeping through the night - consistent - taking it as directed   Review of Systems   Social History   Tobacco Use  Smoking Status Former   Packs/day: 1.00   Years: 10.00   Total pack years: 10.00   Types: Cigarettes, E-cigarettes   Quit date: 06/02/2019   Years since quitting: 3.1  Smokeless Tobacco Never  Tobacco Comments   heaviest 1.5 PPD        Objective:    BP Readings from Last 3 Encounters:  07/10/22 122/78  04/03/22 120/70  05/08/21 110/78   Wt Readings from Last 3 Encounters:  07/10/22 (!) 357 lb (161.9 kg)  04/03/22 (!) 343 lb 5 oz (155.7 kg)  05/08/21 (!) 340 lb 3.2 oz (154.3 kg)    BP 122/78   Pulse 80   Temp 97.8 F (36.6 C) (Temporal)   Ht 5' 4.5" (1.638 m)   Wt (!) 357 lb (161.9 kg)   LMP 06/26/2022 (Approximate)   SpO2 97%   BMI 60.33 kg/m    Physical Exam Constitutional:      General: She is not in acute distress.    Appearance: She is well-developed. She is not diaphoretic.  HENT:     Right Ear: External ear normal.     Left Ear: External ear normal.     Nose: Nose normal.  Eyes:     Conjunctiva/sclera: Conjunctivae normal.  Cardiovascular:     Rate and Rhythm: Normal rate and regular rhythm.     Heart sounds: No murmur heard. Pulmonary:     Effort: Pulmonary effort is normal. No respiratory distress.     Breath sounds: Normal breath sounds.  No wheezing.  Musculoskeletal:     Cervical back: Neck supple.  Skin:    General: Skin is warm and dry.     Capillary Refill: Capillary refill takes less than 2 seconds.  Neurological:     Mental Status: She is alert. Mental status is at baseline.  Psychiatric:        Mood and Affect: Mood normal.        Behavior: Behavior normal.           Assessment & Plan:   Problem List Items Addressed This Visit       Endocrine   Hypothyroidism - Primary    She missed a recheck, she has been taking her medication as directed.  Recheck thyroid today, if still elevated will plan to increase dose of levothyroxine      Relevant Orders   TSH     Other   Morbid obesity (HCC)    Gained weight.  Patient is interested in lifestyle change, she is started exercising and is working on [employer].  Discussed if she has trouble losing weight would recommend that  she consider healthy weight and wellness, medication or even bariatric consult.        Return in about 4 months (around 11/10/2022) for TOC with Tabitha.  Lynnda Child, MD

## 2022-07-17 ENCOUNTER — Other Ambulatory Visit: Payer: Self-pay | Admitting: Family Medicine

## 2022-07-17 DIAGNOSIS — E039 Hypothyroidism, unspecified: Secondary | ICD-10-CM

## 2022-07-18 ENCOUNTER — Encounter: Payer: Self-pay | Admitting: Family Medicine

## 2022-07-18 DIAGNOSIS — E039 Hypothyroidism, unspecified: Secondary | ICD-10-CM

## 2022-07-18 MED ORDER — LEVOTHYROXINE SODIUM 200 MCG PO TABS: 200.0000 ug | ORAL_TABLET | Freq: Every day | ORAL | 2 refills | Status: DC

## 2022-10-10 ENCOUNTER — Telehealth: Payer: Self-pay | Admitting: Family Medicine

## 2022-10-10 NOTE — Telephone Encounter (Signed)
  Encourage patient to contact the pharmacy for refills or they can request refills through [place]:  Please schedule appointment if longer than 1 year  NEXT APPOINTMENT DATE: levothyroxine (SYNTHROID) 200 MCG tablet MEDICATION:  levothyroxine (SYNTHROID) 25 MCG tablet  Is the patient out of medication?   [place], [place] - [address]    Let patient know to contact pharmacy at the end of the day to make sure medication is ready.  Please notify patient to allow 48-72 hours to process  CLINICAL FILLS OUT ALL BELOW:   LAST REFILL:  QTY:  REFILL DATE:    OTHER COMMENTS:    Okay for refill?  Please advise

## 2022-10-10 NOTE — Telephone Encounter (Signed)
Tried to call pt. No answer. Sent a mychart message letting her know that she has refills available for both rx's at her pharmacy.  Pt responded to mychart.

## 2022-11-20 ENCOUNTER — Emergency Department
Admission: EM | Admit: 2022-11-20 | Discharge: 2022-11-20 | Disposition: A | Discharge status: Home / Self Care | Payer: Medicaid Other | Attending: Emergency Medicine | Admitting: Emergency Medicine

## 2022-11-20 ENCOUNTER — Encounter: Payer: Self-pay | Admitting: Emergency Medicine

## 2022-11-20 ENCOUNTER — Other Ambulatory Visit: Payer: Self-pay

## 2022-11-20 DIAGNOSIS — B338 Other specified viral diseases: Secondary | ICD-10-CM

## 2022-11-20 DIAGNOSIS — Z20822 Contact with and (suspected) exposure to covid-19: Secondary | ICD-10-CM | POA: Insufficient documentation

## 2022-11-20 DIAGNOSIS — J40 Bronchitis, not specified as acute or chronic: Secondary | ICD-10-CM | POA: Insufficient documentation

## 2022-11-20 DIAGNOSIS — B974 Respiratory syncytial virus as the cause of diseases classified elsewhere: Secondary | ICD-10-CM | POA: Insufficient documentation

## 2022-11-20 LAB — RESP PANEL BY RT-PCR (RSV, FLU A&B, COVID)  RVPGX2
Influenza A by PCR: NEGATIVE
Influenza B by PCR: NEGATIVE
Resp Syncytial Virus by PCR: POSITIVE — AB
SARS Coronavirus 2 by RT PCR: NEGATIVE

## 2022-11-20 MED ORDER — ALBUTEROL SULFATE HFA 108 (90 BASE) MCG/ACT IN AERS: 2.0000 | INHALATION_SPRAY | Freq: Four times a day (QID) | RESPIRATORY_TRACT | 2 refills | Status: AC | PRN

## 2022-11-20 MED ORDER — PREDNISONE 50 MG PO TABS: 50.0000 mg | ORAL_TABLET | Freq: Every day | ORAL | 0 refills | Status: AC

## 2022-11-20 MED ORDER — BENZONATATE 100 MG PO CAPS: 100.0000 mg | ORAL_CAPSULE | Freq: Three times a day (TID) | ORAL | 0 refills | Status: AC | PRN

## 2022-11-20 NOTE — ED Provider Triage Note (Signed)
Emergency Medicine Provider Triage Evaluation Note  Cherree Conerly, a 31 y.o. female  was evaluated in triage.  Pt complains of cough and congestion. she endorses similar symptoms in her girlfriend as well as diarrhea.  Review of Systems  Positive: Cough, diarrhea Negative: CP  Physical Exam  There were no vitals taken for this visit. Gen:   Awake, no distress  NAD Resp:  Normal effort CTA MSK:   Moves extremities without difficulty  Other:    Medical Decision Making  Medically screening exam initiated at 12:03 PM.  Appropriate orders placed.  Dani Gobble was informed that the remainder of the evaluation will be completed by another provider, this initial triage assessment does not replace that evaluation, and the importance of remaining in the ED until their evaluation is complete.  Patient to the ED for evaluation of nonproductive cough and diarrhea for the last 2 days.  She reports similar symptoms in her close contacts.   Lissa Hoard, PA-C 11/20/22 1221

## 2022-11-20 NOTE — ED Triage Notes (Signed)
Dry non-productive cough x 2 days and diarrhea; Patient's girlfriend is sick with same symptoms

## 2022-11-20 NOTE — Discharge Instructions (Addendum)
-  Please take the full course of the prednisone as prescribed.  -You may utilize the albuterol inhaler as needed for the chest tightness.  You may take the benzonatate as needed for the cough.  -Follow-up with your primary care provider as needed.  -Return to the emergency department anytime if you begin to experience any new or worsening symptoms.

## 2022-11-20 NOTE — ED Provider Notes (Signed)
[facility] Provider Note    Event Date/Time   First MD Initiated Contact with Patient 11/20/22 1333     (approximate)   History   Chief Complaint Cough (Dry non-productive cough x 2 days and diarrhea; Patient's girlfriend is sick with same symptoms)   HPI Kristen Knight is a 31 y.o. female, morbid obesity, hyperlipidemia, presents to the emergency department for cough, chest tightness, congestion, and diarrhea x 2 days.  She presents with her girlfriend, who is also sick with the same symptoms.  She denies fever/chills, shortness of breath, abdominal pain, flank pain, nausea/vomiting, rash/lesions, dizziness/lightheadedness, or weakness.   History Limitations: No limitations.        Physical Exam  Triage Vital Signs: ED Triage Vitals  Enc Vitals Group     BP 11/20/22 1206 (!) 142/67     Pulse Rate 11/20/22 1206 88     Resp 11/20/22 1206 (!) 22     Temp 11/20/22 1206 98.4 F (36.9 C)     Temp Source 11/20/22 1206 Oral     SpO2 11/20/22 1206 96 %     Weight 11/20/22 1203 (!) 320 lb (145.2 kg)     Height 11/20/22 1203 5\' 4"  (1.626 m)     Head Circumference --      Peak Flow --      Pain Score 11/20/22 1203 0     Pain Loc --      Pain Edu? --      Excl. in GC? --     Most recent vital signs: Vitals:   11/20/22 1206  BP: (!) 142/67  Pulse: 88  Resp: (!) 22  Temp: 98.4 F (36.9 C)  SpO2: 96%    General: Awake, NAD.  Skin: Warm, dry. No rashes or lesions.  Eyes: PERRL. Conjunctivae normal.  CV: Good peripheral perfusion.  Resp: Normal effort.  Lung sounds are clear bilaterally. Abd: Soft, non-tender. No distention.  Neuro: At baseline. No gross neurological deficits.  Musculoskeletal: Normal ROM of all extremities.  Physical Exam    ED Results / Procedures / Treatments  Labs (all labs ordered are listed, but only abnormal results are displayed) Labs Reviewed  RESP PANEL BY RT-PCR (RSV, FLU A&B, COVID)  [id number] - Abnormal; Notable  for the following components:      Result Value   Resp Syncytial Virus by PCR POSITIVE (*)    All other components within normal limits     EKG N/A.    RADIOLOGY  ED Provider Interpretation: N/A.  No results found.  PROCEDURES:  Critical Care performed: N/A.  Procedures    MEDICATIONS ORDERED IN ED: Medications - No data to display   IMPRESSION / MDM / ASSESSMENT AND PLAN / ED COURSE  I reviewed the triage vital signs and the nursing notes.                               Differential diagnosis includes, but is not limited to, COVID-19, influenza, RSV, bronchitis, community-acquired pneumonia, strep pharyngitis.   Assessment/Plan Presentation consistent with bronchitis, likely secondary to RSV.  She appears well clinically.  Lung sounds are clear bilaterally in the apices and bases.  Chest x-ray not indicated at this time.  Very low suspicion for pneumonia.  Respiratory panel negative for COVID-19 or influenza, which is positive for RSV.  Strep PCR negative.  I suspect that she will recover well on her own.  Will provide her a short course of prednisone, as well as albuterol inhaler benzonatate to help manage her symptoms.  Recommend that she follow with her primary care provider as needed.  Provided her with a work note as well.  Will discharge.   Provided the patient with anticipatory guidance, return precautions, and educational material. Encouraged the patient to return to the emergency department at any time if they begin to experience any new or worsening symptoms. Patient expressed understanding and agreed with the plan.    Patient's presentation is most consistent with acute complicated illness / injury requiring diagnostic workup      FINAL CLINICAL IMPRESSION(S) / ED DIAGNOSES   Final diagnoses:  RSV (respiratory syncytial virus infection)  Bronchitis     Rx / DC Orders   ED Discharge Orders          Ordered    predniSONE (DELTASONE) 50 MG tablet   Daily with breakfast        11/20/22 1403    albuterol (VENTOLIN HFA) 108 (90 Base) MCG/ACT inhaler  Every 6 hours PRN        11/20/22 1403    benzonatate (TESSALON PERLES) 100 MG capsule  3 times daily PRN        11/20/22 1403             Note:  This document was prepared using Dragon voice recognition software and may include unintentional dictation errors.   Varney Daily, [place] 11/20/22 1416    Minna Antis, MD 11/20/22 1531

## 2022-11-24 ENCOUNTER — Telehealth: Payer: Self-pay

## 2022-11-24 NOTE — Telephone Encounter (Signed)
Transition Care Management Unsuccessful Follow-up Telephone Call  Date of discharge and from where:  TCM DC [facility] ER 11-20-22 - Dx: RSV  Attempts:  1st Attempt  Reason for unsuccessful TCM follow-up call:  Left voice message   Woodfin Ganja LPN [facility] Nurse Health Advisor Direct Dial [phone]

## 2022-11-27 ENCOUNTER — Encounter: Payer: Self-pay | Admitting: Family

## 2022-11-28 ENCOUNTER — Emergency Department
Admission: EM | Admit: 2022-11-28 | Discharge: 2022-11-28 | Disposition: A | Discharge status: Home / Self Care | Payer: Medicaid Other | Attending: Emergency Medicine | Admitting: Emergency Medicine

## 2022-11-28 ENCOUNTER — Encounter: Payer: Self-pay | Admitting: Emergency Medicine

## 2022-11-28 ENCOUNTER — Other Ambulatory Visit: Payer: Self-pay

## 2022-11-28 ENCOUNTER — Ambulatory Visit: Admit: 2022-11-28 | Disposition: A | Discharge status: Home / Self Care | Payer: Medicaid Other

## 2022-11-28 DIAGNOSIS — R030 Elevated blood-pressure reading, without diagnosis of hypertension: Secondary | ICD-10-CM | POA: Insufficient documentation

## 2022-11-28 DIAGNOSIS — N939 Abnormal uterine and vaginal bleeding, unspecified: Secondary | ICD-10-CM

## 2022-11-28 LAB — CBC
HCT: 37.2 % (ref 36.0–46.0)
Hemoglobin: 12 g/dL (ref 12.0–15.0)
MCH: 27.6 pg (ref 26.0–34.0)
MCHC: 32.3 g/dL (ref 30.0–36.0)
MCV: 85.5 fL (ref 80.0–100.0)
Platelets: 298 10*3/uL (ref 150–400)
RBC: 4.35 MIL/uL (ref 3.87–5.11)
RDW: 14.8 % (ref 11.5–15.5)
WBC: 13.3 10*3/uL — ABNORMAL HIGH (ref 4.0–10.5)
nRBC: 0 % (ref 0.0–0.2)

## 2022-11-28 LAB — POC URINE PREG, ED: Preg Test, Ur: NEGATIVE

## 2022-11-28 MED ORDER — NORGESTIM-ETH ESTRAD TRIPHASIC 0.18/0.215/0.25 MG-25 MCG PO TABS: 1.0000 | ORAL_TABLET | Freq: Every day | ORAL | 11 refills | Status: DC

## 2022-11-28 NOTE — ED Provider Notes (Signed)
[facility] Provider Note    Event Date/Time   First MD Initiated Contact with Patient 11/28/22 1050     (approximate)   History   Vaginal Bleeding   HPI  Kristen Knight is a 31 y.o. female who reports a long history of abnormal menses who reports over the last 3 months she has been having typical abnormal menses although does report that has been heavier than usual which is concerned her.  She reports that she used to take birth control but is in between PCPs at the moment and would need a refill on her medication.  No abdominal pain.     Physical Exam   Triage Vital Signs: ED Triage Vitals  Enc Vitals Group     BP 11/28/22 1051 (!) 140/70     Pulse Rate 11/28/22 1051 80     Resp 11/28/22 1051 20     Temp 11/28/22 1051 98 F (36.7 C)     Temp Source 11/28/22 1051 Oral     SpO2 11/28/22 1051 96 %     Weight 11/28/22 1051 (!) 145 kg (319 lb 10.7 oz)     Height 11/28/22 1051 1.626 m (5\' 4" )     Head Circumference --      Peak Flow --      Pain Score 11/28/22 1036 0     Pain Loc --      Pain Edu? --      Excl. in GC? --     Most recent vital signs: Vitals:   11/28/22 1051  BP: (!) 140/70  Pulse: 80  Resp: 20  Temp: 98 F (36.7 C)  SpO2: 96%     General: Awake, no distress.  CV:  Good peripheral perfuion.  Resp:  Normal effort.  Abd:  No distention.  Other:     ED Results / Procedures / Treatments   Labs (all labs ordered are listed, but only abnormal results are displayed) Labs Reviewed  CBC - Abnormal; Notable for the following components:      Result Value   WBC 13.3 (*)    All other components within normal limits  POC URINE PREG, ED     EKG     RADIOLOGY     PROCEDURES:  Critical Care performed:   Procedures   MEDICATIONS ORDERED IN ED: Medications - No data to display   IMPRESSION / MDM / ASSESSMENT AND PLAN / ED COURSE  I reviewed the triage vital signs and the nursing notes. Patient's  presentation is most consistent with exacerbation of chronic illness.   Patient reports long history of abnormal menses.  She is concerned about increased bleeding, will check CBC to evaluate for significant anemia.  Pregnancy test is negative  Hemoglobin is stable, patient is relieved by this, will refill Ortho Tri-Cyclen which is what she has taken in the past, discussed risks of oral contraceptives and she agrees with this plan       FINAL CLINICAL IMPRESSION(S) / ED DIAGNOSES   Final diagnoses:  Abnormal vaginal bleeding     Rx / DC Orders   ED Discharge Orders          Ordered    Norgestimate-Ethinyl Estradiol Triphasic (ORTHO TRI-CYCLEN LO) 0.18/0.215/0.25 MG-25 MCG tab  Daily        11/28/22 1130             Note:  This document was prepared using Dragon voice recognition software and may include unintentional  dictation errors.   Jene Every, MD 11/28/22 1134

## 2022-11-28 NOTE — ED Triage Notes (Signed)
Pt comes with c/o vaginal bleeding for over 3 months now. Pt states she was prescribed birth control but prescription ran out before she could fill it. Pt not on anything at this time.

## 2022-12-28 ENCOUNTER — Encounter: Payer: Self-pay | Admitting: Family

## 2022-12-30 ENCOUNTER — Other Ambulatory Visit: Payer: Self-pay

## 2022-12-30 ENCOUNTER — Encounter: Payer: Self-pay | Admitting: Emergency Medicine

## 2022-12-30 ENCOUNTER — Emergency Department: Payer: Self-pay

## 2022-12-30 ENCOUNTER — Emergency Department
Admission: EM | Admit: 2022-12-30 | Discharge: 2022-12-30 | Disposition: A | Discharge status: Home / Self Care | Payer: Self-pay | Attending: Emergency Medicine | Admitting: Emergency Medicine

## 2022-12-30 DIAGNOSIS — E039 Hypothyroidism, unspecified: Secondary | ICD-10-CM | POA: Insufficient documentation

## 2022-12-30 DIAGNOSIS — D649 Anemia, unspecified: Secondary | ICD-10-CM | POA: Insufficient documentation

## 2022-12-30 DIAGNOSIS — N938 Other specified abnormal uterine and vaginal bleeding: Secondary | ICD-10-CM | POA: Insufficient documentation

## 2022-12-30 LAB — COMPREHENSIVE METABOLIC PANEL
ALT: 37 U/L (ref 0–44)
AST: 45 U/L — ABNORMAL HIGH (ref 15–41)
Albumin: 3.5 g/dL (ref 3.5–5.0)
Alkaline Phosphatase: 41 U/L (ref 38–126)
Anion gap: 11 (ref 5–15)
BUN: 10 mg/dL (ref 6–20)
CO2: 23 mmol/L (ref 22–32)
Calcium: 8.8 mg/dL — ABNORMAL LOW (ref 8.9–10.3)
Chloride: 101 mmol/L (ref 98–111)
Creatinine, Ser: 0.67 mg/dL (ref 0.44–1.00)
GFR, Estimated: >60 mL/min (ref >60)
Glucose, Bld: 105 mg/dL — ABNORMAL HIGH (ref 70–99)
Potassium: 3.7 mmol/L (ref 3.5–5.1)
Sodium: 135 mmol/L (ref 135–145)
Total Bilirubin: 0.5 mg/dL (ref 0.3–1.2)
Total Protein: 7.7 g/dL (ref 6.5–8.1)

## 2022-12-30 LAB — URINALYSIS, ROUTINE W REFLEX MICROSCOPIC
Bacteria, UA: NONE SEEN
RBC / HPF: >50 RBC/hpf — ABNORMAL HIGH (ref 0–5)
Specific Gravity, Urine: 1.019 (ref 1.005–1.030)

## 2022-12-30 LAB — CBC WITH DIFFERENTIAL/PLATELET
Abs Immature Granulocytes: 0.12 10*3/uL — ABNORMAL HIGH (ref 0.00–0.07)
Basophils Absolute: 0.1 10*3/uL (ref 0.0–0.1)
Basophils Relative: 1 %
Eosinophils Absolute: 0.4 10*3/uL (ref 0.0–0.5)
Eosinophils Relative: 3 %
HCT: 32.5 % — ABNORMAL LOW (ref 36.0–46.0)
Hemoglobin: 10.6 g/dL — ABNORMAL LOW (ref 12.0–15.0)
Immature Granulocytes: 1 %
Lymphocytes Relative: 25 %
Lymphs Abs: 3.2 10*3/uL (ref 0.7–4.0)
MCH: 27.9 pg (ref 26.0–34.0)
MCHC: 32.6 g/dL (ref 30.0–36.0)
MCV: 85.5 fL (ref 80.0–100.0)
Monocytes Absolute: 0.6 10*3/uL (ref 0.1–1.0)
Monocytes Relative: 5 %
Neutro Abs: 8.5 10*3/uL — ABNORMAL HIGH (ref 1.7–7.7)
Neutrophils Relative %: 65 %
Platelets: 287 10*3/uL (ref 150–400)
RBC: 3.8 MIL/uL — ABNORMAL LOW (ref 3.87–5.11)
RDW: 15.5 % (ref 11.5–15.5)
WBC: 12.9 10*3/uL — ABNORMAL HIGH (ref 4.0–10.5)
nRBC: 0 % (ref 0.0–0.2)

## 2022-12-30 LAB — POC URINE PREG, ED: Preg Test, Ur: NEGATIVE

## 2022-12-30 MED ORDER — FERROUS SULFATE 325 (65 FE) MG PO TABS: 325.0000 mg | ORAL_TABLET | Freq: Every day | ORAL | 3 refills | Status: DC

## 2022-12-30 MED ORDER — ONDANSETRON 4 MG PO TBDP: 4.0000 mg | ORAL_TABLET | Freq: Four times a day (QID) | ORAL | 0 refills | Status: DC | PRN

## 2022-12-30 MED ORDER — TRANEXAMIC ACID 650 MG PO TABS: 1300.0000 mg | ORAL_TABLET | Freq: Three times a day (TID) | ORAL | 0 refills | Status: DC

## 2022-12-30 MED ORDER — TRANEXAMIC ACID 650 MG PO TABS
1300.0000 mg | ORAL_TABLET | Freq: Once | ORAL | Status: AC
  Administered 2022-12-30: 1300 mg via ORAL
  Filled 2022-12-30: qty 2

## 2022-12-30 MED ORDER — IBUPROFEN 800 MG PO TABS
800.0000 mg | ORAL_TABLET | Freq: Once | ORAL | Status: AC
  Administered 2022-12-30: 800 mg via ORAL
  Filled 2022-12-30: qty 1

## 2022-12-30 MED ORDER — IBUPROFEN 800 MG PO TABS: 800.0000 mg | ORAL_TABLET | Freq: Three times a day (TID) | ORAL | 0 refills | Status: DC | PRN

## 2022-12-30 NOTE — ED Triage Notes (Addendum)
Patient ambulatory to triage with steady gait, without difficulty or distress noted; pt reports hx abnormal vag bleeding, rx oral BC; last 10 days having heavy vag bleeding

## 2022-12-30 NOTE — Discharge Instructions (Addendum)
You may alternate Tylenol 1000 mg every 6 hours as needed for pain, fever and Ibuprofen 800 mg every 6-8 hours as needed for pain, fever.  Please take Ibuprofen with food.  Do not take more than 4000 mg of Tylenol (acetaminophen) in a 24 hour period.  Hemoglobin today was 10.6  Ultrasound  IMPRESSION:  1. No acute findings. No evidence for ovarian torsion.  2. Multiple prominent peripheral follicles are identified in the  right ovary. This finding can be seen in the setting of polycystic  ovarian syndrome.  3. Endometrial stripe thickness measures 12.0 mm. If bleeding  remains unresponsive to hormonal or medical therapy, sonohysterogram  should be considered for focal lesion work-up. (Ref: Radiological  Reasoning: Algorithmic Workup of Abnormal Vaginal Bleeding with  Endovaginal Sonography and Sonohysterography. AJR 2008; [id number]:[id number])  .

## 2022-12-30 NOTE — ED Notes (Signed)
Patient transported to Ultrasound 

## 2022-12-30 NOTE — ED Provider Notes (Signed)
[facility] Provider Note    Event Date/Time   First MD Initiated Contact with Patient 12/30/22 0406     (approximate)   History   Vaginal Bleeding   HPI  Kristen Knight is a 32 y.o. female with history of hypothyroidism, obesity who presents to the emergency department with heavy vaginal bleeding for the past 10 days with pelvic pain.  She is on birth control.  Was seen here about a month ago for the same.  States she started back on her birth control which stopped the bleeding and then about a week later she started bleeding again.  She states this is not the first time she has had intermittent and heavy bleeding.  She has not seen OB/GYN yet.  No fevers, vomiting.  No dysuria, abnormal vaginal discharge or odor.   History provided by patient, family.    Past Medical History:  Diagnosis Date   Hypothyroid     Past Surgical History:  Procedure Laterality Date   WISDOM TOOTH EXTRACTION      MEDICATIONS:  Prior to Admission medications   Medication Sig Start Date End Date Taking? Authorizing Provider  albuterol (VENTOLIN HFA) 108 (90 Base) MCG/ACT inhaler Inhale 2 puffs into the lungs every 6 (six) hours as needed for wheezing or shortness of breath. 11/20/22   Teodoro Spray, PA  levothyroxine (SYNTHROID) 200 MCG tablet Take 1 tablet (200 mcg total) by mouth daily. 07/18/22   Waunita Schooner, MD  levothyroxine (SYNTHROID) 25 MCG tablet Take 1 tablet (25 mcg total) by mouth daily. Take with 200 mcg tablet daily. 07/10/22   Waunita Schooner, MD  Norgestimate-Ethinyl Estradiol Triphasic (ORTHO TRI-CYCLEN LO) 0.18/0.215/0.25 MG-25 MCG tab Take 1 tablet by mouth daily. 11/28/22 11/28/23  Lavonia Drafts, MD    Physical Exam   Triage Vital Signs: ED Triage Vitals [12/30/22 0348]  Enc Vitals Group     BP (!) 152/93     Pulse Rate 88     Resp 18     Temp 98.4 F (36.9 C)     Temp Source Oral     SpO2 98 %     Weight (!) 315 lb (142.9 kg)     Height 5'  5" (1.651 m)     Head Circumference      Peak Flow      Pain Score 8     Pain Loc      Pain Edu?      Excl. in [place]?     Most recent vital signs: Vitals:   12/30/22 0348 12/30/22 0630  BP: (!) 152/93   Pulse: 88 84  Resp: 18 17  Temp: 98.4 F (36.9 C)   SpO2: 98% 100%    CONSTITUTIONAL: Alert and oriented and responds appropriately to questions. Well-appearing; well-nourished, obese, pleasant HEAD: Normocephalic, atraumatic EYES: Conjunctivae clear, pupils appear equal, sclera nonicteric ENT: normal nose; moist mucous membranes NECK: Supple, normal ROM CARD: RRR; S1 and S2 appreciated; no murmurs, no clicks, no rubs, no gallops RESP: Normal chest excursion without splinting or tachypnea; breath sounds clear and equal bilaterally; no wheezes, no rhonchi, no rales, no hypoxia or respiratory distress, speaking full sentences ABD/GI: Normal bowel sounds; non-distended; soft, non-tender, no rebound, no guarding, no peritoneal signs, exam somewhat limited due to body habitus BACK: The back appears normal EXT: Normal ROM in all joints; no deformity noted, no edema; no cyanosis SKIN: Normal color for age and race; warm; no rash on exposed skin NEURO:  Moves all extremities equally, normal speech PSYCH: The patient's mood and manner are appropriate.   ED Results / Procedures / Treatments   LABS: (all labs ordered are listed, but only abnormal results are displayed) Labs Reviewed  CBC WITH DIFFERENTIAL/PLATELET - Abnormal; Notable for the following components:      Result Value   WBC 12.9 (*)    RBC 3.80 (*)    Hemoglobin 10.6 (*)    HCT 32.5 (*)    Neutro Abs 8.5 (*)    Abs Immature Granulocytes 0.12 (*)    All other components within normal limits  COMPREHENSIVE METABOLIC PANEL - Abnormal; Notable for the following components:   Glucose, Bld 105 (*)    Calcium 8.8 (*)    AST 45 (*)    All other components within normal limits  URINALYSIS, ROUTINE W REFLEX MICROSCOPIC -  Abnormal; Notable for the following components:   Color, Urine RED (*)    APPearance HAZY (*)    Glucose, UA   (*)    Value: TEST NOT REPORTED DUE TO COLOR INTERFERENCE OF URINE PIGMENT   Hgb urine dipstick   (*)    Value: TEST NOT REPORTED DUE TO COLOR INTERFERENCE OF URINE PIGMENT   Bilirubin Urine   (*)    Value: TEST NOT REPORTED DUE TO COLOR INTERFERENCE OF URINE PIGMENT   Ketones, ur   (*)    Value: TEST NOT REPORTED DUE TO COLOR INTERFERENCE OF URINE PIGMENT   Protein, ur   (*)    Value: TEST NOT REPORTED DUE TO COLOR INTERFERENCE OF URINE PIGMENT   Nitrite   (*)    Value: TEST NOT REPORTED DUE TO COLOR INTERFERENCE OF URINE PIGMENT   Leukocytes,Ua   (*)    Value: TEST NOT REPORTED DUE TO COLOR INTERFERENCE OF URINE PIGMENT   RBC / HPF >50 (*)    All other components within normal limits  POC URINE PREG, ED     EKG:  RADIOLOGY: My personal review and interpretation of imaging: Transvaginal ultrasound shows no ovarian cyst or torsion but does show multiple follicles in the right ovary and a thick endometrial stripe.  No fibroids.  I have personally reviewed all radiology reports.   US PELVIC COMPLETE W TRANSVAGINAL AND TORSION R/O  Result Date: 12/30/2022 CLINICAL DATA:  Dysfunctional uterine bleeding with pelvic pain for 10 days. EXAM: TRANSABDOMINAL AND TRANSVAGINAL ULTRASOUND OF PELVIS DOPPLER ULTRASOUND OF OVARIES TECHNIQUE: Both transabdominal and transvaginal ultrasound examinations of the pelvis were performed. Transabdominal technique was performed for global imaging of the pelvis including uterus, ovaries, adnexal regions, and pelvic cul-de-sac. It was necessary to proceed with endovaginal exam following the transabdominal exam to visualize the endometrium and ovaries. Color and duplex Doppler ultrasound was utilized to evaluate blood flow to the ovaries. COMPARISON:  None Available. FINDINGS: Uterus Measurements: 10.6 x 5.5 x 7.4 cm = volume: 227.1 mL. No fibroids or  other mass visualized. Endometrium Thickness: 12.0 mm.  No focal abnormality visualized. Right ovary Measurements: 3.6 x 2.8 x 3.3 cm = volume: 17.2 mL. Multiple prominent peripheral follicles are identified. No adnexal mass. Left ovary Measurements: 3.7 x 2.4 x 3.9 cm = volume: 18.4 mL. Normal appearance/no adnexal mass. Pulsed Doppler evaluation of both ovaries demonstrates normal low-resistance arterial and venous waveforms. Other findings No abnormal free fluid. IMPRESSION: 1. No acute findings. No evidence for ovarian torsion. 2. Multiple prominent peripheral follicles are identified in the right ovary. This finding can be seen in the  setting of polycystic ovarian syndrome. 3. Endometrial stripe thickness measures 12.0 mm. If bleeding remains unresponsive to hormonal or medical therapy, sonohysterogram should be considered for focal lesion work-up. (Ref: Radiological Reasoning: Algorithmic Workup of Abnormal Vaginal Bleeding with Endovaginal Sonography and Sonohysterography. AJR 2008; 465:K35-46) . Electronically Signed   By: Kerby Moors M.D.   On: 12/30/2022 06:32     PROCEDURES:  Critical Care performed: No     .1-3 Lead EKG Interpretation  Performed by: Idrees Quam, Delice Bison, DO Authorized by: Willona Phariss, Delice Bison, DO     Interpretation: normal     ECG rate:  88   ECG rate assessment: normal     Rhythm: sinus rhythm     Ectopy: none     Conduction: normal       IMPRESSION / MDM / ASSESSMENT AND PLAN / ED COURSE  I reviewed the triage vital signs and the nursing notes.    Patient here with history of dysfunctional uterine bleeding with heavy bleeding for the past 10 days.  The patient is on the cardiac monitor to evaluate for evidence of arrhythmia and/or significant heart rate changes.   DIFFERENTIAL DIAGNOSIS (includes but not limited to):   Anemia, uterine fibroid, endometrial hyperplasia, endometrial cancer, PCOS, torsion, cyst, pregnancy, ectopic   Patient's presentation is  most consistent with acute presentation with potential threat to life or bodily function.   PLAN: Will obtain CBC, BMP, pregnancy test, transvaginal ultrasound with Doppler.  Will give less Diette, ibuprofen for symptomatic relief.   MEDICATIONS GIVEN IN ED: Medications  tranexamic acid (LYSTEDA) tablet 1,300 mg (1,300 mg Oral Given 12/30/22 0624)  ibuprofen (ADVIL) tablet 800 mg (800 mg Oral Given 12/30/22 5681)     ED COURSE: Patient's hemoglobin currently 10.6.  Will start on iron supplementation normal platelets.  Pregnancy test negative.  Normal electrolytes and renal function.  Patient's ultrasound reviewed and interpreted by myself and the radiologist shows thickened endometrial stripe of 12 mm and multiple follicles in the right ovary consistent with PCOS.  She has no formal diagnosis of PCOS.  Recommended close OB/GYN follow-up as an outpatient for further evaluation, treatment.  Recommend she continue her birth control pills and will discharge on less Diette.  Will discharge with ibuprofen, Zofran for symptomatic relief.  Remains hemodynamically stable here without signs of hemorrhage.   At this time, I do not feel there is any life-threatening condition present. I reviewed all nursing notes, vitals, pertinent previous records.  All lab and urine results, EKGs, imaging ordered have been independently reviewed and interpreted by myself.  I reviewed all available radiology reports from any imaging ordered this visit.  Based on my assessment, I feel the patient is safe to be discharged home without further emergent workup and can continue workup as an outpatient as needed. Discussed all findings, treatment plan as well as usual and customary return precautions.  They verbalize understanding and are comfortable with this plan.  Outpatient follow-up has been provided as needed.  All questions have been answered.    CONSULTS:  none   OUTSIDE RECORDS REVIEWED: Reviewed last PCP note on  04/03/2022.       FINAL CLINICAL IMPRESSION(S) / ED DIAGNOSES   Final diagnoses:  Dysfunctional uterine bleeding  Anemia, unspecified type     Rx / DC Orders   ED Discharge Orders          Ordered    tranexamic acid (LYSTEDA) 650 MG TABS tablet  3 times daily  12/30/22 0423    ondansetron (ZOFRAN-ODT) 4 MG disintegrating tablet  Every 6 hours PRN        12/30/22 0423    ibuprofen (ADVIL) 800 MG tablet  Every 8 hours PRN        12/30/22 0423    ferrous sulfate 325 (65 FE) MG tablet  Daily        12/30/22 0451             Note:  This document was prepared using Dragon voice recognition software and may include unintentional dictation errors.   Jamien Casanova, Layla Maw, DO 12/30/22 [phone]

## 2022-12-31 ENCOUNTER — Encounter: Payer: Self-pay | Admitting: Family

## 2022-12-31 ENCOUNTER — Ambulatory Visit (INDEPENDENT_AMBULATORY_CARE_PROVIDER_SITE_OTHER): Payer: Self-pay | Admitting: Family

## 2022-12-31 ENCOUNTER — Encounter: Payer: Self-pay | Admitting: *Deleted

## 2022-12-31 DIAGNOSIS — E559 Vitamin D deficiency, unspecified: Secondary | ICD-10-CM

## 2022-12-31 DIAGNOSIS — E785 Hyperlipidemia, unspecified: Secondary | ICD-10-CM

## 2022-12-31 DIAGNOSIS — Z6841 Body Mass Index (BMI) 40.0 and over, adult: Secondary | ICD-10-CM

## 2022-12-31 DIAGNOSIS — E049 Nontoxic goiter, unspecified: Secondary | ICD-10-CM

## 2022-12-31 DIAGNOSIS — Z114 Encounter for screening for human immunodeficiency virus [HIV]: Secondary | ICD-10-CM

## 2022-12-31 DIAGNOSIS — E039 Hypothyroidism, unspecified: Secondary | ICD-10-CM

## 2022-12-31 DIAGNOSIS — L409 Psoriasis, unspecified: Secondary | ICD-10-CM

## 2022-12-31 DIAGNOSIS — Z0000 Encounter for general adult medical examination without abnormal findings: Secondary | ICD-10-CM | POA: Insufficient documentation

## 2022-12-31 DIAGNOSIS — E282 Polycystic ovarian syndrome: Secondary | ICD-10-CM

## 2022-12-31 DIAGNOSIS — Z9189 Other specified personal risk factors, not elsewhere classified: Secondary | ICD-10-CM

## 2022-12-31 DIAGNOSIS — L680 Hirsutism: Secondary | ICD-10-CM

## 2022-12-31 DIAGNOSIS — N921 Excessive and frequent menstruation with irregular cycle: Secondary | ICD-10-CM

## 2022-12-31 DIAGNOSIS — Z1159 Encounter for screening for other viral diseases: Secondary | ICD-10-CM

## 2022-12-31 DIAGNOSIS — L719 Rosacea, unspecified: Secondary | ICD-10-CM

## 2022-12-31 LAB — T4, FREE: Free T4: 0.56 ng/dL — ABNORMAL LOW (ref 0.60–1.60)

## 2022-12-31 LAB — HEMOGLOBIN A1C: Hgb A1c MFr Bld: 5.8 % (ref 4.6–6.5)

## 2022-12-31 LAB — FOLLICLE STIMULATING HORMONE: FSH: 2.9 m[IU]/mL

## 2022-12-31 LAB — TSH: TSH: 23.72 u[IU]/mL — ABNORMAL HIGH (ref 0.35–5.50)

## 2022-12-31 LAB — VITAMIN D 25 HYDROXY (VIT D DEFICIENCY, FRACTURES): VITD: 10.52 ng/mL — ABNORMAL LOW (ref 30.00–100.00)

## 2022-12-31 MED ORDER — DESONIDE 0.05 % EX CREA: TOPICAL_CREAM | Freq: Two times a day (BID) | CUTANEOUS | 0 refills | Status: DC

## 2022-12-31 MED ORDER — NORGESTIM-ETH ESTRAD TRIPHASIC 0.18/0.215/0.25 MG-35 MCG PO TABS: 1.0000 | ORAL_TABLET | Freq: Every day | ORAL | 11 refills | Status: DC

## 2022-12-31 MED ORDER — METRONIDAZOLE 0.75 % EX CREA: TOPICAL_CREAM | Freq: Two times a day (BID) | CUTANEOUS | 0 refills | Status: DC

## 2022-12-31 NOTE — Progress Notes (Signed)
Established Patient Office Visit  Subjective:  Patient ID: Kristen Knight, female    DOB: 07/16/1991  Age: 32 y.o. MRN: [id number]  CC:  Chief Complaint  Patient presents with   Establish Care   Menstrual Problem    HPI Kristen Knight is here for a transition of care visit.  Prior provider was:Dr. Gweneth Dimitri  Pt is with acute concerns.   Pap - overdue .   Heavy menstrual cycle. Was started on birth control and then trial of lysteda which she states has not really helped much. She states that she has often had irregular menses since onset around age 63. Take ibuprofen with periods as well. Thinks not feeling so great on the ortho tri cyclen lo.  Mom and twin sister with endometriosis. Aunt has pcos.   Does have slight acne, pimples on face at times. Does also have hirsutism on face.   Does get boils between legs, under armpits, and in groin area.   chronic concerns:  Hypothyroid: on levothyroxine 225 mcg once daily. Does take separate from food and or other medications.  Lab Results  Component Value Date   TSH 8.50 (H) 07/10/2022   IDA: taking daily iron tablets for this. Labs yesterday in ER for ongoing bleeding, and with ida. Just started iron today.  Lab Results  Component Value Date   WBC 12.9 (H) 12/30/2022   HGB 10.6 (L) 12/30/2022   HCT 32.5 (L) 12/30/2022   MCV 85.5 12/30/2022   PLT 287 12/30/2022     Leukocytosis: mild, pt is a smoker with obesity  Past Medical History:  Diagnosis Date   Hypothyroid     Past Surgical History:  Procedure Laterality Date   WISDOM TOOTH EXTRACTION      Family History  Problem Relation Age of Onset   Hypothyroidism Mother    Mental illness Mother    Alcohol abuse Mother    Drug abuse Father        meth   Heart attack Maternal Grandmother     Social History   Socioeconomic History   Marital status: Media planner    Spouse name: Not on file   Number of children: 0   Years of education: 11th grade    Highest education level: Not on file  Occupational History    Employer: [employer]  Tobacco Use   Smoking status: Former    Packs/day: 1.00    Years: 10.00    Total pack years: 10.00    Types: Cigarettes, E-cigarettes    Quit date: 06/02/2019    Years since quitting: 3.5   Smokeless tobacco: Never   Tobacco comments:    heaviest 1.5 PPD  Vaping Use   Vaping Use: Every day   Start date: 06/02/2019   Substances: Nicotine   Devices: Disposable  Substance and Sexual Activity   Alcohol use: Never   Drug use: Yes    Types: Marijuana    Comment: nightly, before bed   Sexual activity: Yes    Partners: Female    Birth control/protection: None    Comment: female partner  Other Topics Concern   Not on file  Social History Narrative   04/03/22   From: the area   Living: [place], partner since 2016   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, theatre/television/film   Dad- estranged, drug abuse with meth, in prison       Family: MGM and aunt nearby, mom in [place]      Enjoys: video games, mountains, trail walking  Exercise: lots of steps at work   Diet: trying to eat healthy      Safety   Seat belts: Yes    Guns: No   Safe in relationships: Yes       Social Determinants of Radio broadcast assistant Strain: Not on file  Food Insecurity: Not on file  Transportation Needs: Not on file  Physical Activity: Not on file  Stress: Not on file  Social Connections: Not on file  Intimate Partner Violence: Not on file    Outpatient Medications Prior to Visit  Medication Sig Dispense Refill   albuterol (VENTOLIN HFA) 108 (90 Base) MCG/ACT inhaler Inhale 2 puffs into the lungs every 6 (six) hours as needed for wheezing or shortness of breath. 8 g 2   ferrous sulfate 325 (65 FE) MG tablet Take 1 tablet (325 mg total) by mouth daily. 30 tablet 3   ibuprofen (ADVIL) 800 MG tablet Take 1 tablet (800 mg total) by mouth every 8 (eight) hours as needed. 30 tablet 0   levothyroxine (SYNTHROID) 200 MCG tablet Take 1 tablet (200  mcg total) by mouth daily. 90 tablet 2   levothyroxine (SYNTHROID) 25 MCG tablet Take 1 tablet (25 mcg total) by mouth daily. Take with 200 mcg tablet daily. 90 tablet 3   tranexamic acid (LYSTEDA) 650 MG TABS tablet Take 2 tablets (1,300 mg total) by mouth 3 (three) times daily. 30 tablet 0   Norgestimate-Ethinyl Estradiol Triphasic (ORTHO TRI-CYCLEN LO) 0.18/0.215/0.25 MG-25 MCG tab Take 1 tablet by mouth daily. 28 tablet 11   ondansetron (ZOFRAN-ODT) 4 MG disintegrating tablet Take 1 tablet (4 mg total) by mouth every 6 (six) hours as needed for nausea or vomiting. (Patient not taking: Reported on 12/31/2022) 20 tablet 0   No facility-administered medications prior to visit.    No Known Allergies  ROS Review of Systems  Systems negative unless otherwise addressed in HPI   Objective:    Physical Exam Vitals reviewed.  Constitutional:      General: She is not in acute distress.    Appearance: Normal appearance. She is obese. She is not ill-appearing, toxic-appearing or diaphoretic.  HENT:     Right Ear: Tympanic membrane normal.     Left Ear: Tympanic membrane normal.     Mouth/Throat:     Mouth: Mucous membranes are moist.     Pharynx: No pharyngeal swelling.     Tonsils: No tonsillar exudate.  Eyes:     Extraocular Movements: Extraocular movements intact.     Conjunctiva/sclera: Conjunctivae normal.     Pupils: Pupils are equal, round, and reactive to light.  Neck:     Thyroid: Thyromegaly present. No thyroid mass or thyroid tenderness.  Cardiovascular:     Rate and Rhythm: Normal rate and regular rhythm.  Pulmonary:     Effort: Pulmonary effort is normal.     Breath sounds: Normal breath sounds.  Abdominal:     General: Abdomen is flat. Bowel sounds are normal.     Palpations: Abdomen is soft.  Musculoskeletal:        General: Normal range of motion.  Lymphadenopathy:     Cervical:     Right cervical: No superficial cervical adenopathy.    Left cervical: No  superficial cervical adenopathy.  Skin:    General: Skin is warm.     Capillary Refill: Capillary refill takes less than 2 seconds.     Comments: Erythematic warm to touch rash on bil cheeks and bridge  of nose with actinic appearance  Dry scaly textured skin on bil ears on auricle  Neurological:     General: No focal deficit present.     Mental Status: She is alert and oriented to person, place, and time.  Psychiatric:        Mood and Affect: Mood normal.        Behavior: Behavior normal.        Thought Content: Thought content normal.        Judgment: Judgment normal.       BP (!) 140/78   Pulse 90   Temp 98.7 F (37.1 C) (Oral)   Ht 5\' 5"  (1.651 m)   Wt (!) 367 lb (166.5 kg)   LMP 12/18/2022 (Approximate)   SpO2 98%   BMI 61.07 kg/m  Wt Readings from Last 3 Encounters:  12/31/22 (!) 367 lb (166.5 kg)  12/30/22 (!) 315 lb (142.9 kg)  11/28/22 (!) 319 lb 10.7 oz (145 kg)     Health Maintenance Due  Topic Date Due   HPV VACCINES (3 - 2-dose series) 10/21/2006   Hepatitis C Screening  Never done   PAP SMEAR-Modifier  Never done   DTaP/Tdap/Td (7 - Td or Tdap) 04/30/2016       Topic Date Due   HPV VACCINES (3 - 2-dose series) 10/21/2006    Lab Results  Component Value Date   TSH 8.50 (H) 07/10/2022   Lab Results  Component Value Date   WBC 12.9 (H) 12/30/2022   HGB 10.6 (L) 12/30/2022   HCT 32.5 (L) 12/30/2022   MCV 85.5 12/30/2022   PLT 287 12/30/2022   Lab Results  Component Value Date   NA 135 12/30/2022   K 3.7 12/30/2022   CO2 23 12/30/2022   GLUCOSE 105 (H) 12/30/2022   BUN 10 12/30/2022   CREATININE 0.67 12/30/2022   BILITOT 0.5 12/30/2022   ALKPHOS 41 12/30/2022   AST 45 (H) 12/30/2022   ALT 37 12/30/2022   PROT 7.7 12/30/2022   ALBUMIN 3.5 12/30/2022   CALCIUM 8.8 (L) 12/30/2022   ANIONGAP 11 12/30/2022   GFR 117.85 04/03/2022   Lab Results  Component Value Date   CHOL 259 (H) 04/03/2022   Lab Results  Component Value Date    HDL 43.50 04/03/2022   Lab Results  Component Value Date   LDLCALC 169 (H) 05/22/2021   Lab Results  Component Value Date   TRIG 213.0 (H) 04/03/2022   Lab Results  Component Value Date   CHOLHDL 6 04/03/2022   Lab Results  Component Value Date   HGBA1C 5.5 04/03/2022      Assessment & Plan:   Problem List Items Addressed This Visit       Endocrine   Hypothyroidism    Enlarged on exam possibly, order u/s thyroid for ongoing TSH elevation  R/o other etiologies to include graves, thyroid disease, and or hashimotos  Tsh free t4 ordered pending results.  Continue levothyroxine 225 mcg once daily       Relevant Orders   TSH   T4, free   04/05/2022 THYROID   PCOS (polycystic ovarian syndrome)    Diagnosis made today with inclusion of hirsutism, acne, uterine fibroids, as well as irregular periods.[place] Tri-Cyclen Lo changed Ortho Tri-Cyclen as patient still with heavy period.  Did advise patient to take Lucido for a maximum of 5 days and to not take birth control at this time advised patient to resume birth control once completed  with the 5 days.  Referral made to OB/GYN for ongoing evaluation and treatment as still heavy menses. Possible differential diagnosis endometriosis. Could consider metformin in the future Did discuss with patient weight loss and how this would improve symptoms likely we could consider weight loss medication.        Musculoskeletal and Integument   Psoriasis    Rx desonide cream       Relevant Medications   desonide (DESOWEN) 0.05 % cream   Rosacea   Relevant Medications   metroNIDAZOLE (METROCREAM) 0.75 % cream   Hirsutism    Helps to completed dx of pcos      Relevant Orders   Follicle stimulating hormone   Prolactin   TSH   T4, free     Other   Vitamin D deficiency    Discussed with patient to start over-the-counter vitamin D 2000 IUs once daily.  Will order vitamin D as well pending results      Relevant Orders   VITAMIN D 25  Hydroxy (Vit-D Deficiency, Fractures)   Hyperlipidemia    Advised patient to work on low-cholesterol diet and exercise as tolerated.  We will not order lipid panel today as patient is not fasting.      Morbid obesity with BMI of 60.0-69.9, adult ([place]) - Primary    Discussed goal of weight loss Did mention to patient pending lab results and ultrasound of the thyroid we may be able to consider that found and/or other medications used for weight loss with injection administration. Discussed working on diet and exercise as these may also help the other conditions to include hidradenitis suppurativa as well as PCOS.      Relevant Medications   Norgestimate-Ethinyl Estradiol Triphasic 0.18/0.215/0.25 MG-35 MCG tablet   Other Relevant Orders   Follicle stimulating hormone   Prolactin   Ambulatory referral to Obstetrics / Gynecology   Hemoglobin A1c   Menorrhagia with irregular cycle    Referral placed to OB/GYN      Relevant Medications   Norgestimate-Ethinyl Estradiol Triphasic 0.18/0.215/0.25 MG-35 MCG tablet   Other Relevant Orders   Follicle stimulating hormone   Prolactin   Testos,Total,Free and SHBG (Female)   Ambulatory referral to Obstetrics / Gynecology   Other Visit Diagnoses     At risk for diabetes mellitus       Relevant Orders   Hemoglobin A1c   Encounter for hepatitis C screening test for low risk patient       Relevant Orders   Hepatitis C Antibody   Screening for HIV (human immunodeficiency virus)       Relevant Orders   HIV antibody (with reflex)   Goiter       Relevant Orders   US THYROID      Patient is due for tetanus vaccination as well as third HPV vaccination as her 2 first HPV vaccinations were given less than 5 months apart.  Advised patient with Medicaid she will need to obtain this at the pharmacy patient verbalized acknowledgment.  Meds ordered this encounter  Medications   Norgestimate-Ethinyl Estradiol Triphasic 0.18/0.215/0.25 MG-35 MCG  tablet    Sig: Take 1 tablet by mouth daily.    Dispense:  28 tablet    Refill:  11    Order Specific Question:   Supervising Provider    Answer:   Diona Browner, AMY E [[id number]]   desonide (DESOWEN) 0.05 % cream    Sig: Apply topically 2 (two) times daily. Apply topically twice daily prn on  the ears    Dispense:  30 g    Refill:  0    Order Specific Question:   Supervising Provider    Answer:   BEDSOLE, AMY E [[id number]]   metroNIDAZOLE (METROCREAM) 0.75 % cream    Sig: Apply topically 2 (two) times daily.    Dispense:  45 g    Refill:  0    Order Specific Question:   Supervising Provider    Answer:   BEDSOLE, AMY E [[id number]]    Follow-up: Return in about 3 weeks (around 01/21/2023) for f/u obesity .    Mort Sawyers, FNP

## 2022-12-31 NOTE — Assessment & Plan Note (Signed)
Diagnosis made today with inclusion of hirsutism, acne, uterine fibroids, as well as irregular periods.Kristen Knight Tri-Cyclen Lo changed Ortho Tri-Cyclen as patient still with heavy period.  Did advise patient to take Lucido for a maximum of 5 days and to not take birth control at this time advised patient to resume birth control once completed with the 5 days.  Referral made to OB/GYN for ongoing evaluation and treatment as still heavy menses. Possible differential diagnosis endometriosis. Could consider metformin in the future Did discuss with patient weight loss and how this would improve symptoms likely we could consider weight loss medication.

## 2022-12-31 NOTE — Assessment & Plan Note (Signed)
Advised patient to work on low-cholesterol diet and exercise as tolerated.  We will not order lipid panel today as patient is not fasting.

## 2022-12-31 NOTE — Assessment & Plan Note (Signed)
Referral placed to OBGYN

## 2022-12-31 NOTE — Patient Instructions (Addendum)
Changing birth control from LO to regular ortho cyclen.    ------------------------------------  I have ordered imaging for you at [facility] outpatient diagnostic center.this is for ultrasound thyroid. This order has been sent over for you electronically.  Please call [phone] to schedule this appointment.   ------------------------------------  Stop by the lab prior to leaving today. I will notify you of your results once received.    ------------------------------------  A referral was placed today for obgyn.  Please let us know if you have not heard back within 2 weeks about the referral.   ------------------------------------  Lysteda for four more days, don't take birth control at same time. Restart with new birth control.    ------------------------------------   Recommend you get TD (tetanus) and HPV (third dose) you have to get at the pharmacy.    ------------------------------------  Welcome to our clinic, I am happy to have you as my new patient. I am excited to continue on this healthcare journey with you.  ------------------------------------  Stop by the lab prior to leaving today. I will notify you of your results once received.   Please keep in mind Any my chart messages you send have up to a three business day turnaround for a response.  Phone calls may take up to a one full business day turnaround for a  response.   If you need a medication refill I recommend you request it through the pharmacy as this is easiest for [place] rather than sending a message and or phone call.   Due to recent changes in healthcare laws, you may see results of your imaging and/or laboratory studies on MyChart before I have had a chance to review them.  I understand that in some cases there may be results that are confusing or concerning to you. Please understand that not all results are received at the same time and often I may need to interpret multiple results in order to  provide you with the best plan of care or course of treatment. Therefore, I ask that you please give me 2 business days to thoroughly review all your results before contacting my office for clarification. Should we see a critical lab result, you will be contacted sooner.   It was a pleasure seeing you today! Please do not hesitate to reach out with any questions and or concerns.  Regards,   Eugenia Pancoast FNP-C

## 2022-12-31 NOTE — Assessment & Plan Note (Signed)
Helps to completed dx of pcos

## 2022-12-31 NOTE — Assessment & Plan Note (Signed)
Enlarged on exam possibly, order u/s thyroid for ongoing TSH elevation  R/o other etiologies to include graves, thyroid disease, and or hashimotos  Tsh free t4 ordered pending results.  Continue levothyroxine 225 mcg once daily

## 2022-12-31 NOTE — Assessment & Plan Note (Signed)
Rx desonide cream

## 2022-12-31 NOTE — Telephone Encounter (Signed)
Saw patient already, thank you for speaking with the patient.  Please see note for further information.  

## 2022-12-31 NOTE — Assessment & Plan Note (Signed)
Discussed goal of weight loss Did mention to patient pending lab results and ultrasound of the thyroid we may be able to consider that found and/or other medications used for weight loss with injection administration. Discussed working on diet and exercise as these may also help the other conditions to include hidradenitis suppurativa as well as PCOS.

## 2022-12-31 NOTE — Assessment & Plan Note (Signed)
Discussed with patient to start over-the-counter vitamin D 2000 IUs once daily.  Will order vitamin D as well pending results

## 2023-01-01 ENCOUNTER — Other Ambulatory Visit: Payer: Self-pay | Admitting: Family

## 2023-01-01 DIAGNOSIS — E039 Hypothyroidism, unspecified: Secondary | ICD-10-CM

## 2023-01-01 DIAGNOSIS — E559 Vitamin D deficiency, unspecified: Secondary | ICD-10-CM

## 2023-01-01 MED ORDER — VITAMIN D (ERGOCALCIFEROL) 1.25 MG (50000 UNIT) PO CAPS: 50000.0000 [IU] | ORAL_CAPSULE | ORAL | 0 refills | Status: AC

## 2023-01-01 MED ORDER — LEVOTHYROXINE SODIUM 50 MCG PO TABS: 50.0000 ug | ORAL_TABLET | Freq: Every day | ORAL | 3 refills | Status: DC

## 2023-01-01 NOTE — Progress Notes (Signed)
Your vitamin D was a bit on the lower range. I will send an RX for vitamin D3 50,000 IU which you will take once weekly for 8 weeks. Once RX is complete, please continue over the counter Vitamin D3 1000 IU once daily. Return to the clinic for a follow up in three months to repeat your Vitamin D level.   Predaibetic per A1c work on diabetic diet exercise as tolerated.   Ask pt if she scheduled u/s thyroid yet, pending this to see if we can give weight loss injection that will also help prediabetes.   Lastly thyroid coming down but not completely. Sending in [place] name synthroid , make sure when you pick it up it is brand not levothyroxine. Will send in 250 mcg and will add on liothyrinine as Free t4 low.

## 2023-01-04 LAB — TESTOS,TOTAL,FREE AND SHBG (FEMALE)
Free Testosterone: 2.2 pg/mL (ref 0.1–6.4)
Sex Hormone Binding: 94.2 nmol/L (ref 17–124)
Testosterone, Total, LC-MS-MS: 41 ng/dL (ref 2–45)

## 2023-01-04 LAB — HEPATITIS C ANTIBODY: Hepatitis C Ab: NONREACTIVE

## 2023-01-04 LAB — HIV ANTIBODY (ROUTINE TESTING W REFLEX): HIV 1&2 Ab, 4th Generation: NONREACTIVE

## 2023-01-04 LAB — PROLACTIN: Prolactin: 11.7 ng/mL

## 2023-01-06 ENCOUNTER — Ambulatory Visit
Admission: RE | Admit: 2023-01-06 | Discharge: 2023-01-06 | Disposition: A | Discharge status: Home / Self Care | Payer: Medicaid Other | Source: Ambulatory Visit | Attending: Family | Admitting: Family

## 2023-01-06 DIAGNOSIS — E049 Nontoxic goiter, unspecified: Secondary | ICD-10-CM

## 2023-01-06 DIAGNOSIS — E039 Hypothyroidism, unspecified: Secondary | ICD-10-CM

## 2023-02-05 ENCOUNTER — Encounter: Payer: Self-pay | Admitting: Family

## 2023-02-05 DIAGNOSIS — E282 Polycystic ovarian syndrome: Secondary | ICD-10-CM

## 2023-02-05 DIAGNOSIS — N926 Irregular menstruation, unspecified: Secondary | ICD-10-CM

## 2023-02-05 DIAGNOSIS — E039 Hypothyroidism, unspecified: Secondary | ICD-10-CM

## 2023-02-06 ENCOUNTER — Encounter: Payer: Self-pay | Admitting: *Deleted

## 2023-02-12 NOTE — Telephone Encounter (Signed)
Please change the location of the [place] SONOHYSTEROGRAM  order to [place].   [facility] does not handle these and [facility] only does these at their [place] location.   Thanks!

## 2023-02-18 ENCOUNTER — Other Ambulatory Visit (INDEPENDENT_AMBULATORY_CARE_PROVIDER_SITE_OTHER): Payer: Self-pay

## 2023-02-18 DIAGNOSIS — E039 Hypothyroidism, unspecified: Secondary | ICD-10-CM

## 2023-02-18 LAB — TSH: TSH: 13.14 u[IU]/mL — ABNORMAL HIGH (ref 0.35–5.50)

## 2023-02-19 ENCOUNTER — Encounter: Payer: Self-pay | Admitting: *Deleted

## 2023-02-19 MED ORDER — SYNTHROID 75 MCG PO TABS: 75.0000 ug | ORAL_TABLET | Freq: Every day | ORAL | 0 refills | Status: DC

## 2023-02-19 NOTE — Addendum Note (Signed)
Addended by: Eugenia Pancoast on: 02/19/2023 11:50 AM   Modules accepted: Orders

## 2023-03-15 ENCOUNTER — Encounter: Payer: Self-pay | Admitting: Family

## 2023-03-15 DIAGNOSIS — E039 Hypothyroidism, unspecified: Secondary | ICD-10-CM

## 2023-03-16 MED ORDER — LEVOTHYROXINE SODIUM 75 MCG PO TABS: 75.0000 ug | ORAL_TABLET | Freq: Every day | ORAL | 3 refills | Status: DC

## 2023-04-02 ENCOUNTER — Ambulatory Visit: Payer: Medicaid Other | Admitting: Family

## 2023-04-15 ENCOUNTER — Ambulatory Visit: Payer: Self-pay | Admitting: Family

## 2023-06-26 ENCOUNTER — Ambulatory Visit (INDEPENDENT_AMBULATORY_CARE_PROVIDER_SITE_OTHER): Payer: Self-pay | Admitting: Family

## 2023-06-26 VITALS — BP 142/90 | HR 91 | Temp 98.6°F | Resp 16 | Wt 364.0 lb

## 2023-06-26 DIAGNOSIS — E282 Polycystic ovarian syndrome: Secondary | ICD-10-CM

## 2023-06-26 DIAGNOSIS — D500 Iron deficiency anemia secondary to blood loss (chronic): Secondary | ICD-10-CM

## 2023-06-26 DIAGNOSIS — N921 Excessive and frequent menstruation with irregular cycle: Secondary | ICD-10-CM

## 2023-06-26 DIAGNOSIS — E039 Hypothyroidism, unspecified: Secondary | ICD-10-CM

## 2023-06-26 DIAGNOSIS — R7303 Prediabetes: Secondary | ICD-10-CM

## 2023-06-26 DIAGNOSIS — R030 Elevated blood-pressure reading, without diagnosis of hypertension: Secondary | ICD-10-CM

## 2023-06-26 DIAGNOSIS — E559 Vitamin D deficiency, unspecified: Secondary | ICD-10-CM

## 2023-06-26 LAB — CBC
HCT: 34.6 % — ABNORMAL LOW (ref 36.0–46.0)
Hemoglobin: 11.1 g/dL — ABNORMAL LOW (ref 12.0–15.0)
MCHC: 32.1 g/dL (ref 30.0–36.0)
MCV: 82.9 fl (ref 78.0–100.0)
Platelets: 363 10*3/uL (ref 150.0–400.0)
RBC: 4.17 Mil/uL (ref 3.87–5.11)
RDW: 17.9 % — ABNORMAL HIGH (ref 11.5–15.5)
WBC: 13.8 10*3/uL — ABNORMAL HIGH (ref 4.0–10.5)

## 2023-06-26 LAB — IBC + FERRITIN
Ferritin: 15.8 ng/mL (ref 10.0–291.0)
Iron: 44 ug/dL (ref 42–145)
Saturation Ratios: 9.5 % — ABNORMAL LOW (ref 20.0–50.0)
TIBC: 464.8 ug/dL — ABNORMAL HIGH (ref 250.0–450.0)
Transferrin: 332 mg/dL (ref 212.0–360.0)

## 2023-06-26 LAB — VITAMIN D 25 HYDROXY (VIT D DEFICIENCY, FRACTURES): VITD: 10.88 ng/mL — ABNORMAL LOW (ref 30.00–100.00)

## 2023-06-26 LAB — T4, FREE: Free T4: 0.6 ng/dL (ref 0.60–1.60)

## 2023-06-26 LAB — TSH: TSH: 36.98 u[IU]/mL — ABNORMAL HIGH (ref 0.35–5.50)

## 2023-06-26 MED ORDER — METFORMIN HCL 500 MG PO TABS: 500.0000 mg | ORAL_TABLET | Freq: Every day | ORAL | 3 refills | Status: DC

## 2023-06-26 NOTE — Assessment & Plan Note (Signed)
Pt advised of the following: Work on a diabetic diet, try to incorporate exercise at least 20-30 a day for 3 days a week or more.   

## 2023-06-26 NOTE — Progress Notes (Signed)
Established Patient Office Visit  Subjective:      CC:  Chief Complaint  Patient presents with   Hypothyroidism   Medication Refill    B/c Refill     HPI: Kristen Knight is a 32 y.o. female presenting on 06/26/2023 for Hypothyroidism and Medication Refill (B/c Refill ) . Vitamin d def: restarted on vitamin D supplementation otc 2000 I/U  Prediabetic:   Hypothyroid: on generic levothyroxine 275 mcg once daily, was not able to get synthroid due to cost.   Menses: every time around the time she is to take birth control she has bloody clots, daily. This will last seven hours.   Lab Results  Component Value Date   HGBA1C 5.8 12/31/2022      Social history:  Relevant past medical, surgical, family and social history reviewed and updated as indicated. Interim medical history since our last visit reviewed.  Allergies and medications reviewed and updated.  DATA REVIEWED: CHART IN EPIC     ROS: Negative unless specifically indicated above in HPI.    Current Outpatient Medications:    metFORMIN (GLUCOPHAGE) 500 MG tablet, Take 1 tablet (500 mg total) by mouth daily., Disp: 90 tablet, Rfl: 3   albuterol (VENTOLIN HFA) 108 (90 Base) MCG/ACT inhaler, Inhale 2 puffs into the lungs every 6 (six) hours as needed for wheezing or shortness of breath., Disp: 8 g, Rfl: 2   desonide (DESOWEN) 0.05 % cream, Apply topically 2 (two) times daily. Apply topically twice daily prn on the ears, Disp: 30 g, Rfl: 0   ferrous sulfate 325 (65 FE) MG tablet, Take 1 tablet (325 mg total) by mouth daily., Disp: 30 tablet, Rfl: 3   ibuprofen (ADVIL) 800 MG tablet, Take 1 tablet (800 mg total) by mouth every 8 (eight) hours as needed., Disp: 30 tablet, Rfl: 0   levothyroxine (SYNTHROID) 200 MCG tablet, Take 1 tablet (200 mcg total) by mouth daily., Disp: 90 tablet, Rfl: 2   levothyroxine (SYNTHROID) 75 MCG tablet, Take 1 tablet (75 mcg total) by mouth daily., Disp: 90 tablet, Rfl: 3   metroNIDAZOLE  (METROCREAM) 0.75 % cream, Apply topically 2 (two) times daily., Disp: 45 g, Rfl: 0   Norgestimate-Ethinyl Estradiol Triphasic 0.18/0.215/0.25 MG-35 MCG tablet, Take 1 tablet by mouth daily., Disp: 28 tablet, Rfl: 11   tranexamic acid (LYSTEDA) 650 MG TABS tablet, Take 2 tablets (1,300 mg total) by mouth 3 (three) times daily., Disp: 30 tablet, Rfl: 0      Objective:    BP (!) 142/90   Pulse 91   Temp 98.6 F (37 C) (Temporal)   Resp 16   Wt (!) 364 lb (165.1 kg)   SpO2 98%   BMI 60.57 kg/m   Wt Readings from Last 3 Encounters:  06/26/23 (!) 364 lb (165.1 kg)  12/31/22 (!) 367 lb (166.5 kg)  12/30/22 (!) 315 lb (142.9 kg)    Physical Exam Constitutional:      General: She is not in acute distress.    Appearance: Normal appearance. She is normal weight. She is not ill-appearing, toxic-appearing or diaphoretic.  HENT:     Head: Normocephalic.  Cardiovascular:     Rate and Rhythm: Normal rate.  Pulmonary:     Effort: Pulmonary effort is normal.  Musculoskeletal:        General: Normal range of motion.  Neurological:     General: No focal deficit present.     Mental Status: She is alert and oriented to person, place,  and time. Mental status is at baseline.  Psychiatric:        Mood and Affect: Mood normal.        Behavior: Behavior normal.        Thought Content: Thought content normal.        Judgment: Judgment normal.           Assessment & Plan:  Vitamin D deficiency Assessment & Plan: Continue daily vitamin D  Labs ordered pending results   Orders: -     VITAMIN D 25 Hydroxy (Vit-D Deficiency, Fractures)  PCOS (polycystic ovarian syndrome) Assessment & Plan: U/s reviewed.  Ongoing heavy bleeding Referral placed for gynecology  Advised pt to try to take ocp same time of day.  Start metformin 500 mg once daily   Orders: -     metFORMIN HCl; Take 1 tablet (500 mg total) by mouth daily.  Dispense: 90 tablet; Refill: 3 -     Ambulatory referral to  Obstetrics / Gynecology  Acquired hypothyroidism Assessment & Plan: Ordering tsh tpo and free t4 today pending results Continue levothyroxine 275 mcg once daily  Will send new referral once pt insurance changes in October   Orders: -     TSH -     Thyroid Peroxidase Antibodies (TPO) (REFL) -     T4, free  Iron deficiency anemia due to chronic blood loss Assessment & Plan: Suspected result of heavy bleeding  Cbc ibc ferritin ordered pending results.  Likely will suggest daily iron supplementation   Orders: -     CBC -     IBC + Ferritin -     Ambulatory referral to Obstetrics / Gynecology  Menorrhagia with irregular cycle -     Ambulatory referral to Obstetrics / Gynecology  Prediabetes Assessment & Plan: Pt advised of the following: Work on a diabetic diet, try to incorporate exercise at least 20-30 a day for 3 days a week or more.    Orders: -     metFORMIN HCl; Take 1 tablet (500 mg total) by mouth daily.  Dispense: 90 tablet; Refill: 3  Elevated blood pressure reading without diagnosis of hypertension Assessment & Plan: Pt advised of the following:  Continue medication as prescribed. Monitor blood pressure periodically and/or when you feel symptomatic. Goal is <130/90 on average. Ensure that you have rested for 30 minutes prior to checking your blood pressure. Record your readings and bring them to your next visit if necessary.work on a low sodium diet.       Return in about 2 months (around 08/27/2023) for f/u blood pressure.  Mort Sawyers, MSN, APRN, FNP-C [facility]

## 2023-06-26 NOTE — Assessment & Plan Note (Signed)
Continue daily vitamin D  Labs ordered pending results

## 2023-06-26 NOTE — Assessment & Plan Note (Signed)
U/s reviewed.  Ongoing heavy bleeding Referral placed for gynecology  Advised pt to try to take ocp same time of day.  Start metformin 500 mg once daily

## 2023-06-26 NOTE — Patient Instructions (Signed)
  A referral was placed today for gynecology  Please let us know if you have not heard back within 2 weeks about the referral.  Recommend starting metformin, start at 500 mg once daily.  Continue birth control.   Labs today pending results.    Regards,   Mort Sawyers FNP-C

## 2023-06-26 NOTE — Assessment & Plan Note (Signed)
Suspected result of heavy bleeding  Cbc ibc ferritin ordered pending results.  Likely will suggest daily iron supplementation

## 2023-06-26 NOTE — Assessment & Plan Note (Signed)
Ordering tsh tpo and free t4 today pending results Continue levothyroxine 275 mcg once daily  Will send new referral once pt insurance changes in October

## 2023-06-26 NOTE — Assessment & Plan Note (Signed)
Pt advised of the following:  Continue medication as prescribed. Monitor blood pressure periodically and/or when you feel symptomatic. Goal is <130/90 on average. Ensure that you have rested for 30 minutes prior to checking your blood pressure. Record your readings and bring them to your next visit if necessary.work on a low sodium diet.  

## 2023-06-29 LAB — THYROID PEROXIDASE ANTIBODIES (TPO) (REFL): Thyroperoxidase Ab SerPl-aCnc: 343 IU/mL — ABNORMAL HIGH (ref <9)

## 2023-06-30 ENCOUNTER — Encounter: Payer: Self-pay | Admitting: Family

## 2023-06-30 ENCOUNTER — Other Ambulatory Visit: Payer: Self-pay | Admitting: Family

## 2023-06-30 DIAGNOSIS — E038 Other specified hypothyroidism: Secondary | ICD-10-CM

## 2023-06-30 DIAGNOSIS — E559 Vitamin D deficiency, unspecified: Secondary | ICD-10-CM

## 2023-06-30 MED ORDER — LEVOTHYROXINE SODIUM 200 MCG PO CAPS
200.0000 ug | ORAL_CAPSULE | Freq: Every morning | ORAL | 2 refills | Status: DC
Start: 2023-06-30 — End: 2023-07-01

## 2023-06-30 MED ORDER — LEVOTHYROXINE SODIUM 75 MCG PO CAPS
75.0000 ug | ORAL_CAPSULE | Freq: Every day | ORAL | 2 refills | Status: DC
Start: 2023-06-30 — End: 2023-07-01

## 2023-06-30 MED ORDER — CHOLECALCIFEROL 1.25 MG (50000 UT) PO TABS: 1.0000 | ORAL_TABLET | ORAL | 0 refills | Status: DC

## 2023-07-01 ENCOUNTER — Other Ambulatory Visit: Payer: Self-pay | Admitting: Family

## 2023-07-01 ENCOUNTER — Ambulatory Visit: Payer: Self-pay | Admitting: Family

## 2023-07-01 DIAGNOSIS — E063 Autoimmune thyroiditis: Secondary | ICD-10-CM

## 2023-07-01 MED ORDER — LEVOTHYROXINE SODIUM 75 MCG PO CAPS
75.0000 ug | ORAL_CAPSULE | Freq: Every day | ORAL | 2 refills | Status: DC
Start: 2023-07-01 — End: 2023-11-10

## 2023-07-01 MED ORDER — LEVOTHYROXINE SODIUM 75 MCG PO CAPS
75.0000 ug | ORAL_CAPSULE | Freq: Every day | ORAL | 2 refills | Status: DC
Start: 2023-07-01 — End: 2023-07-01

## 2023-07-01 MED ORDER — TRANEXAMIC ACID 650 MG PO TABS: 1300.0000 mg | ORAL_TABLET | Freq: Three times a day (TID) | ORAL | 0 refills | Status: DC

## 2023-07-01 MED ORDER — LEVOTHYROXINE SODIUM 200 MCG PO CAPS
200.0000 ug | ORAL_CAPSULE | Freq: Every morning | ORAL | 2 refills | Status: DC
Start: 2023-07-01 — End: 2023-07-01

## 2023-07-01 MED ORDER — LEVOTHYROXINE SODIUM 200 MCG PO CAPS
200.0000 ug | ORAL_CAPSULE | Freq: Every morning | ORAL | 2 refills | Status: DC
Start: 2023-07-01 — End: 2023-08-06

## 2023-07-01 NOTE — Addendum Note (Signed)
Addended by: Damita Lack on: 07/01/2023 02:23 PM   Modules accepted: Orders

## 2023-07-01 NOTE — Addendum Note (Signed)
Addended by: Benedict Needy on: 07/01/2023 10:40 AM   Modules accepted: Orders

## 2023-07-01 NOTE — Telephone Encounter (Signed)
Please see Mychart message needs refill on Lysteda. Pended refill.

## 2023-07-06 ENCOUNTER — Ambulatory Visit: Payer: Self-pay | Admitting: Family

## 2023-07-13 ENCOUNTER — Other Ambulatory Visit (HOSPITAL_COMMUNITY)
Admission: RE | Admit: 2023-07-13 | Discharge: 2023-07-13 | Disposition: A | Discharge status: Home / Self Care | Payer: Self-pay | Source: Ambulatory Visit | Attending: Obstetrics | Admitting: Obstetrics

## 2023-07-13 ENCOUNTER — Ambulatory Visit (INDEPENDENT_AMBULATORY_CARE_PROVIDER_SITE_OTHER): Payer: Self-pay | Admitting: Obstetrics

## 2023-07-13 ENCOUNTER — Encounter: Payer: Self-pay | Admitting: Obstetrics

## 2023-07-13 VITALS — BP 146/82 | HR 91 | Resp 16 | Ht 64.0 in | Wt 359.2 lb

## 2023-07-13 DIAGNOSIS — N939 Abnormal uterine and vaginal bleeding, unspecified: Secondary | ICD-10-CM | POA: Insufficient documentation

## 2023-07-13 DIAGNOSIS — Z124 Encounter for screening for malignant neoplasm of cervix: Secondary | ICD-10-CM

## 2023-07-13 DIAGNOSIS — E282 Polycystic ovarian syndrome: Secondary | ICD-10-CM

## 2023-07-13 DIAGNOSIS — N898 Other specified noninflammatory disorders of vagina: Secondary | ICD-10-CM | POA: Insufficient documentation

## 2023-07-13 DIAGNOSIS — Z30430 Encounter for insertion of intrauterine contraceptive device: Secondary | ICD-10-CM

## 2023-07-13 MED ORDER — MISOPROSTOL 200 MCG PO TABS
200.0000 ug | ORAL_TABLET | Freq: Once | ORAL | 0 refills | Status: DC
Start: 2023-07-13 — End: 2024-03-15

## 2023-07-13 MED ORDER — DESOGESTREL-ETHINYL ESTRADIOL 0.15-0.02/0.01 MG (21/5) PO TABS
1.0000 | ORAL_TABLET | Freq: Every day | ORAL | 4 refills | Status: DC
Start: 2023-07-13 — End: 2023-11-02

## 2023-07-13 NOTE — Progress Notes (Signed)
    GYNECOLOGY PROGRESS NOTE  Subjective:    Patient ID: Kristen Knight, female    DOB: [birth year], 32 y.o.   MRN: [id number]  HPI  Patient is a 32 y.o. [id number] female who presents for evaluation of menorrhagia. She has a history of PCOS and a family history of endometriosis with mother. She has been bleeding on and off x 1 year. She has severe cramping sometimes with the bleeding.  She has never had a Gyn physical exam, nor a pap smear.   She describes years of heavy periods, and over the last year has had almost daily vaginal bleeding.Menarche at age 32. She is hypothyroid and is on Levothyroxine. She has between a tobacco user, but switched to vaping. In the past she was placed on a low dose , triphasic pill to lighten her cycles, but this has not helped and she continues with irregular, unpredictable vaginal bleeding. She has considered a hysterectomy as she does not plan on having children.  Period Pattern: (!) Irregular Menstrual Flow: Heavy Menstrual Control: Maxi pad Menstrual Control Change Freq (Hours): 1-2 Dysmenorrhea: (!) Severe Dysmenorrhea Symptoms: Cramping   The following portions of the patient's history were reviewed and updated as appropriate: allergies, current medications, past family history, past medical history, past social history, past surgical history, and problem list.  Review of Systems Pertinent items noted in HPI and remainder of comprehensive ROS otherwise negative.   Objective:   Resp. rate 16, height 5\' 4"  (1.626 m), weight (!) 359 lb 3.2 oz (162.9 kg). Body mass index is 61.66 kg/m. General appearance: alert, cooperative, and no distress Abdomen: soft, non-tender; bowel sounds normal; no masses,  no organomegaly Pelvic: cervix normal in appearance, external genitalia normal, rectovaginal septum normal, vagina normal without discharge, and some difficulty visualizing cervix due to high BMI. Unable to perform bimanual due to body habitus. Extremities:  extremities normal, atraumatic, no cyanosis or edema and Homans sign is negative, no sign of DVT Neurologic: Alert and oriented X 3, normal strength and tone. Normal symmetric reflexes. Normal coordination and gait   Assessment:   32 year old  with heavy , ongoing vaginal spotting Reported Hx of PCOS Hypothyroidism Morbid obesity (BMI 61) Vaping habit   Plan:   Considerable time spent reviewing with her, both her menstrual history, and the probable reasons for her irregular cycles. We discussed the influence of her likely PCOS and high BMI on her hormone levels I suspect the low dosage OCPs previously prescribed are not effective in light of her BMI. Her smoking habit has also been a factor in terms of limiting the choices of OCPs.  Ideally, she would work towards significant weight loss and to refrain from vaping so as to reduce cardiovascular risks.  Discussed her case with Dr. Logan Bores, who advises starting her on Mircette OCPs. This is discussed with the patient, and we will plan on follow up in several weeks. She is also considering a Mirena IUD, and can make an appointment for placement. Insertion may be more difficult due to body habitus. Will schedule with an MD provider. I have ordered some cytotec to assist with IUD placement.  I have also retrieved a pap smear today as she has never had a pap.  Kristen Knight, CNM [facility]

## 2023-07-13 NOTE — Patient Instructions (Signed)
Polycystic Ovary Syndrome  Polycystic ovarian syndrome (PCOS) is a common hormonal disorder among women of reproductive age. In most women with PCOS, small fluid-filled sacs (cysts) grow on the ovaries. PCOS can cause problems with menstrual periods and make it hard to get and stay pregnant. If this condition is not treated, it can lead to serious health problems, such as diabetes and heart disease. What are the causes? The cause of this condition is not known. It may be due to certain factors, such as: Irregular menstrual cycle. High levels of certain hormones. Problems with the hormone that helps to control blood sugar (insulin). Certain genes. What increases the risk? You are more likely to develop this condition if you: Have a family history of PCOS or type 2 diabetes. Are overweight, eat unhealthy foods, and are not active. These factors may cause problems with blood sugar control, which can contribute to PCOS or PCOS symptoms. What are the signs or symptoms? Symptoms of this condition include: Ovarian cysts and sometimes pelvic pain. Menstrual periods that are not regular or are too heavy. Inability to get or stay pregnant. Increased growth of hair on the face, chest, stomach, back, thumbs, thighs, or toes. Acne or oily skin. Acne may develop during adulthood, and it may not get better with treatment. Weight gain or obesity. Patches of thickened and dark brown or black skin on the neck, arms, breasts, or thighs. How is this diagnosed? This condition is diagnosed based on: Your medical history. A physical exam that includes a pelvic exam. Your health care provider may look for areas of increased hair growth on your skin. Tests, such as: An ultrasound to check the ovaries for cysts and to view the lining of the uterus. Blood tests to check levels of sugar (glucose), female hormone (testosterone), and female hormones (estrogen and progesterone). How is this treated? There is no cure  for this condition, but treatment can help to manage symptoms and prevent more health problems from developing. Treatment varies depending on your symptoms and if you want to have a baby or if you need birth control. Treatment may include: Making nutrition and lifestyle changes. Taking the progesterone hormone to start a menstrual period. Taking birth control pills to help you have regular menstrual periods. Taking medicines such as: Medicines to make you ovulate, if you want to get pregnant. Medicine to reduce extra hair growth. Having surgery in severe cases. This may involve making small holes in one or both of your ovaries. This decreases the amount of testosterone that your body makes. Follow these instructions at home: Take over-the-counter and prescription medicines only as told by your health care provider. Follow a healthy meal plan that includes lean proteins, complex carbohydrates, fresh fruits and vegetables, low-fat dairy products, healthy fats, and fiber. If you are overweight, lose weight as told by your health care provider. Your health care provider can determine how much weight loss is best for you and can help you lose weight safely. Keep all follow-up visits. This is important. Contact a health care provider if: Your symptoms do not get better with medicine. Your symptoms get worse or you develop new symptoms. Summary Polycystic ovarian syndrome (PCOS) is a common hormonal disorder among women of reproductive age. PCOS can cause problems with menstrual periods and make it hard to get and stay pregnant. If this condition is not treated, it can lead to serious health problems, such as diabetes and heart disease. There is no cure for this condition, but treatment  can help to manage symptoms and prevent more health problems from developing. This information is not intended to replace advice given to you by your health care provider. Make sure you discuss any questions you have  with your health care provider. Document Revised: 05/10/2020 Document Reviewed: 05/10/2020 Elsevier Patient Education  2024 [employer] Intrauterine Device Information An intrauterine device (IUD) is a medical device that is inserted into the uterus to prevent pregnancy. It is a small, T-shaped device that has one or two nylon strings hanging down from it. The strings hang out of the lower part of the uterus (cervix) to allow for future IUD removal. There are two types of IUDs: Hormone IUD. This type of IUD is made of plastic and contains the hormone progestin (synthetic progesterone). A hormone IUD may last 3-5 years. Copper IUD. This type of IUD has copper wire wrapped around it. A copper IUD may last up to 10 years. How is an IUD inserted? An IUD is inserted through the vagina, through the cervix, and into the uterus with a minor medical procedure. The procedure for IUD insertion may vary among health care providers and hospitals. How does an IUD work? Synthetic progesterone in a hormonal IUD prevents pregnancy by: Thickening cervical mucus to prevent sperm from entering the uterus. Thinning the uterine lining to prevent a fertilized egg from being implanted there. Copper in a copper IUD prevents pregnancy by making the uterus and fallopian tubes produce a fluid that kills sperm. What are the advantages of an IUD? Advantages of either type of IUD An IUD: Is highly effective in preventing pregnancy. Is reversible. You can become pregnant shortly after the IUD is removed. Is low-maintenance and can stay in place for a long time. Has no estrogen-related side effects. Can be used when breastfeeding. Is not associated with weight gain. Can be inserted right after childbirth, an abortion, or a miscarriage. Advantages of a hormone IUD If it is inserted within 7 days of your period starting, it works right after it has been inserted. If the hormone IUD is inserted at any other time in your  cycle, you will need to use a backup method of birth control for 7 days after insertion. It can make menstrual periods lighter or stop completely. It can reduce menstrual cramping and other discomforts from menstrual periods. It can be used for 3-5 years, depending on which IUD you have. Advantages of a copper IUD It works right after it is inserted. It can be used as a form of emergency birth control if it is inserted within 5 days after having unprotected sex. It does not interfere with your body's natural hormones. It can be used for up to 10 years. What are the disadvantages of an IUD? An IUD may cause irregular menstrual bleeding for a period of time after insertion. It is common to have pain during insertion and have cramping and vaginal bleeding after insertion. An IUD may cut the uterus (uterine perforation) when it is inserted. This is rare. Pelvic inflammatory disease (PID) may happen after insertion of an IUD. PID is an infection in the uterus and fallopian tubes. The IUD does not cause the infection. The infection is usually from an unknown sexually transmitted infection (STI). This is rare, and it usually happens during the first 20 days after the IUD is inserted. A copper IUD can make your menstrual flow heavier and more painful. IUDs cannot prevent sexually transmitted infections (STIs). How is an IUD removed?  You will  lie on your back with your knees bent and your feet in footrests (stirrups). A device will be inserted into your vagina to spread apart the vaginal walls (speculum). This will allow your health care provider to see the strings attached to the IUD. Your health care provider will use a small instrument (forceps) to grasp the IUD strings and will pull firmly until the IUD is removed. You may have some discomfort when the IUD is removed. Your health care provider may recommend taking over-the-counter pain relievers, such as ibuprofen, before the procedure. You may also  have minor spotting for a few days after the procedure. The procedure for IUD removal may vary among health care providers and hospitals. Is an IUD right for me? If you are interested in an IUD, discuss it with your health care provider. He or she will make sure you are a good candidate for an IUD and will let you know more about the advantages, disadvantage, and possible side effects. This will allow you to make a decision about the device. Summary An intrauterine device (IUD) is a medical device that is inserted in the uterus to prevent pregnancy. It is a small, T-shaped device that has one or two nylon strings hanging down from it. A hormone IUD contains the hormone progestin (synthetic progesterone). A copper IUD has copper wire wrapped around it. Synthetic progesterone in a hormone IUD prevents pregnancy by thickening cervical mucus and thinning the walls of the uterus. Copper in a copper IUD prevents pregnancy by making the uterus and fallopian tubes produce a fluid that kills sperm. A hormone IUD can be left in place for 3-5 years. A copper IUD can be left in place for up to 10 years. An IUD is inserted and removed by a health care provider. You may feel some pain during insertion and removal. Your health care provider may recommend taking over-the-counter pain medicine, such as ibuprofen, before an IUD procedure. This information is not intended to replace advice given to you by your health care provider. Make sure you discuss any questions you have with your health care provider. Document Revised: 06/13/2020 Document Reviewed: 06/13/2020 Elsevier Patient Education  2024 [employer].

## 2023-07-15 ENCOUNTER — Encounter: Payer: Self-pay | Admitting: Obstetrics

## 2023-07-16 ENCOUNTER — Encounter: Payer: Self-pay | Admitting: Obstetrics

## 2023-07-16 ENCOUNTER — Other Ambulatory Visit: Payer: Self-pay

## 2023-07-16 DIAGNOSIS — B9689 Other specified bacterial agents as the cause of diseases classified elsewhere: Secondary | ICD-10-CM

## 2023-07-16 MED ORDER — METRONIDAZOLE 500 MG PO TABS
500.0000 mg | ORAL_TABLET | Freq: Two times a day (BID) | ORAL | 0 refills | Status: DC
Start: 2023-07-16 — End: 2024-03-15

## 2023-07-30 ENCOUNTER — Encounter (INDEPENDENT_AMBULATORY_CARE_PROVIDER_SITE_OTHER): Payer: Self-pay

## 2023-08-05 ENCOUNTER — Encounter: Payer: Self-pay | Admitting: Family

## 2023-08-06 ENCOUNTER — Other Ambulatory Visit: Payer: Self-pay

## 2023-08-06 DIAGNOSIS — E038 Other specified hypothyroidism: Secondary | ICD-10-CM

## 2023-08-06 MED ORDER — LEVOTHYROXINE SODIUM 200 MCG PO CAPS
200.0000 ug | ORAL_CAPSULE | Freq: Every morning | ORAL | 2 refills | Status: DC
Start: 2023-08-06 — End: 2023-11-05

## 2023-08-10 ENCOUNTER — Ambulatory Visit: Payer: Self-pay | Admitting: Obstetrics & Gynecology

## 2023-08-27 ENCOUNTER — Ambulatory Visit: Payer: Self-pay | Admitting: Family

## 2023-10-27 ENCOUNTER — Telehealth: Payer: Self-pay | Admitting: Family

## 2023-10-27 NOTE — Telephone Encounter (Signed)
Mort Sawyers, FNP  P Dugal Pool Received a request to change her levothyroxine manufacturer but I would like to stay with current if possible. Recommend she call around to another pharmacy (pharmacies) to see if they have alvogen brand of her thyroid med. ------------------------------------------------- LM for pt to return call.

## 2023-10-27 NOTE — Telephone Encounter (Signed)
Spoke with pt and is aware of Tabitha's message. States that she has plenty of medication at this time but will go ahead and start calling around to other pharmacies. Nothing further was needed at this time.

## 2023-11-01 ENCOUNTER — Encounter: Payer: Self-pay | Admitting: Obstetrics

## 2023-11-02 ENCOUNTER — Encounter: Payer: Self-pay | Admitting: Family

## 2023-11-02 DIAGNOSIS — E282 Polycystic ovarian syndrome: Secondary | ICD-10-CM

## 2023-11-02 DIAGNOSIS — Z87891 Personal history of nicotine dependence: Secondary | ICD-10-CM

## 2023-11-02 MED ORDER — NORGESTIM-ETH ESTRAD TRIPHASIC 0.18/0.215/0.25 MG-35 MCG PO TABS
1.0000 | ORAL_TABLET | Freq: Every day | ORAL | 11 refills | Status: DC
Start: 2023-11-02 — End: 2023-11-04

## 2023-11-04 MED ORDER — DROSPIRENONE-ETHINYL ESTRADIOL 3-0.02 MG PO TABS
1.0000 | ORAL_TABLET | Freq: Every day | ORAL | 11 refills | Status: DC
Start: 2023-11-04 — End: 2024-03-30

## 2023-11-04 NOTE — Addendum Note (Signed)
Addended by: Mort Sawyers on: 11/04/2023 03:18 PM   Modules accepted: Orders

## 2023-11-05 ENCOUNTER — Other Ambulatory Visit: Payer: Self-pay | Admitting: Family

## 2023-11-05 DIAGNOSIS — E063 Autoimmune thyroiditis: Secondary | ICD-10-CM

## 2023-11-05 NOTE — Telephone Encounter (Signed)
Patient called in stating that she was able to get the 200 mcg filled but not the 75 mcg.

## 2023-11-06 ENCOUNTER — Encounter: Payer: Self-pay | Admitting: Family

## 2023-11-06 DIAGNOSIS — E063 Autoimmune thyroiditis: Secondary | ICD-10-CM

## 2023-11-09 NOTE — Telephone Encounter (Signed)
Can we call to get a better understanding?   Is the levothyroxine they changed it to still a tirosint version (what is it called) and to double check, I need to send in 75 mcg correct? That way she can take total 275 mcg (along with her 200 mcg that was just filled)  It is fine that they switch if they have to as long as pt tolerates it and thyroid levels itself out but we would want to recheck the thyroid level in about six weeks.   When does she think she will have insurance again?

## 2023-11-10 MED ORDER — LEVOTHYROXINE SODIUM 75 MCG PO CAPS
75.0000 ug | ORAL_CAPSULE | Freq: Every day | ORAL | 2 refills | Status: DC
Start: 2023-11-10 — End: 2024-03-15

## 2023-11-18 ENCOUNTER — Other Ambulatory Visit: Payer: Self-pay | Admitting: Family

## 2024-02-07 ENCOUNTER — Other Ambulatory Visit: Payer: Self-pay | Admitting: Family

## 2024-02-07 DIAGNOSIS — E063 Autoimmune thyroiditis: Secondary | ICD-10-CM

## 2024-02-08 MED ORDER — LEVOTHYROXINE SODIUM 200 MCG PO TABS
200.0000 ug | ORAL_TABLET | Freq: Every morning | ORAL | 0 refills | Status: DC
Start: 2024-02-08 — End: 2024-03-15

## 2024-03-11 ENCOUNTER — Encounter: Payer: Self-pay | Admitting: Family

## 2024-03-12 ENCOUNTER — Emergency Department
Admission: EM | Admit: 2024-03-12 | Discharge: 2024-03-12 | Disposition: A | Discharge status: Home / Self Care | Attending: Emergency Medicine | Admitting: Emergency Medicine

## 2024-03-12 ENCOUNTER — Other Ambulatory Visit: Payer: Self-pay

## 2024-03-12 DIAGNOSIS — N898 Other specified noninflammatory disorders of vagina: Secondary | ICD-10-CM | POA: Insufficient documentation

## 2024-03-12 DIAGNOSIS — N771 Vaginitis, vulvitis and vulvovaginitis in diseases classified elsewhere: Secondary | ICD-10-CM | POA: Insufficient documentation

## 2024-03-12 DIAGNOSIS — N760 Acute vaginitis: Secondary | ICD-10-CM

## 2024-03-12 LAB — URINALYSIS, ROUTINE W REFLEX MICROSCOPIC
Bacteria, UA: NONE SEEN
Bilirubin Urine: NEGATIVE
Glucose, UA: NEGATIVE mg/dL
Ketones, ur: NEGATIVE mg/dL
Leukocytes,Ua: NEGATIVE
Nitrite: NEGATIVE
Protein, ur: NEGATIVE mg/dL
Specific Gravity, Urine: 1.01 (ref 1.005–1.030)
pH: 5 (ref 5.0–8.0)

## 2024-03-12 LAB — WET PREP, GENITAL
Clue Cells Wet Prep HPF POC: NONE SEEN
Sperm: NONE SEEN
Trich, Wet Prep: NONE SEEN
WBC, Wet Prep HPF POC: <10 (ref <10)
Yeast Wet Prep HPF POC: NONE SEEN

## 2024-03-12 LAB — CHLAMYDIA/NGC RT PCR (ARMC ONLY)
Chlamydia Tr: NOT DETECTED
N gonorrhoeae: NOT DETECTED

## 2024-03-12 LAB — POC URINE PREG, ED: Preg Test, Ur: NEGATIVE

## 2024-03-12 MED ORDER — CLOTRIMAZOLE 1 % VA CREA: 1.0000 | TOPICAL_CREAM | Freq: Every day | VAGINAL | 1 refills | Status: AC

## 2024-03-12 NOTE — ED Provider Notes (Signed)
 [facility] Provider Note    Event Date/Time   First MD Initiated Contact with Patient 03/12/24 [phone]     (approximate)   History   Vaginal Discharge   HPI  Kristen Knight is a 33 y.o. female   Here with vaginal discharge and irritation to the external vagina for the last several days.  Mucousy discharge.  No abdominal pain.  No dysuria or frequency.  No fevers or chills.  No other acute medical complaints  Independent Historian contributed to assessment above: She is here with her wife who corroborates information past medical history as above  External Medical Documents Reviewed: Gynecology note from July 2024      Physical Exam   Triage Vital Signs: ED Triage Vitals  Encounter Vitals Group     BP 03/12/24 0423 137/74     Systolic BP Percentile --      Diastolic BP Percentile --      Pulse Rate 03/12/24 0423 72     Resp 03/12/24 0423 18     Temp 03/12/24 0423 97.8 F (36.6 C)     Temp Source 03/12/24 0423 Oral     SpO2 03/12/24 0423 98 %     Weight 03/12/24 0423 300 lb (136.1 kg)     Height 03/12/24 0423 5\' 4"  (1.626 m)     Head Circumference --      Peak Flow --      Pain Score 03/12/24 0423 0     Pain Loc --      Pain Education --      Exclude from Growth Chart --     Most recent vital signs: Vitals:   03/12/24 0423  BP: 137/74  Pulse: 72  Resp: 18  Temp: 97.8 F (36.6 C)  SpO2: 98%    General: Awake, no distress.  CV:  Good peripheral perfusion.  Resp:  Normal effort.  Abd:  No distention.  Other:  Comfortable nontoxic appearance and normal vital signs afebrile.  Soft nontender abdomen.  External exam of the vagina/vulva has no erythema lesions or obvious external discharge.  Internally there is some white mucusy discharge, scant, and a normal-appearing cervix.   ED Results / Procedures / Treatments   Labs (all labs ordered are listed, but only abnormal results are displayed) Labs Reviewed  WET PREP, GENITAL   CHLAMYDIA/NGC RT PCR ([facility] ONLY)            URINALYSIS, ROUTINE W REFLEX MICROSCOPIC  POC URINE PREG, ED     I ordered and reviewed the above labs they are notable for negative pregnancy test  PROCEDURES:  Critical Care performed: No  Procedures   MEDICATIONS ORDERED IN ED: Medications - No data to display  IMPRESSION / MDM / ASSESSMENT AND PLAN / ED COURSE  I reviewed the triage vital signs and the nursing notes.                                Patient's presentation is most consistent with acute complicated illness / injury requiring diagnostic workup.  Differential diagnosis includes, but is not limited to, vulvovaginitis, yeast infection, sexually-transmitted infection, bacterial vaginosis, vaginitis due to estrogen deficiency   MDM:    Vaginal irritation and scant mucus-like discharge for several days this otherwise healthy well-appearing patient.  Will get STI testing and urinalysis and pregnancy test today.    Check bacterial vaginosis today and treat with Flagyl  if positive.  I also suspect an element of fungal infection can treat with antifungal topical as well.  She will check the results of her gonorrhea/chlamydia test later today and has a gynecology follow-up within the next 3 days to review tests, response to treatment.  They can also assess if it may be due to hormonal imbalances, as patient has a history of PCOS and hirsutism as well.       FINAL CLINICAL IMPRESSION(S) / ED DIAGNOSES   Final diagnoses:  Vulvovaginitis     Rx / DC Orders   ED Discharge Orders          Ordered    clotrimazole (GYNE-LOTRIMIN) 1 % vaginal cream  Daily at bedtime        03/12/24 0506             Note:  This document was prepared using Dragon voice recognition software and may include unintentional dictation errors.    Pilar Jarvis, MD 03/12/24 [phone]

## 2024-03-12 NOTE — ED Triage Notes (Signed)
 Pt to ED via POV c/o vaginal irritation and discharge. Started x2days ago. Denies any burning with urination.

## 2024-03-12 NOTE — Discharge Instructions (Addendum)
 Use medications as prescribed.  Follow-up with your gynecologist as scheduled on Tuesday to review the results of your still pending STI testing, response to treatment, and discuss further evaluation for other possibilities like atrophy due to possible hormonal imbalances as well.   Thank you for choosing [place] for your health care today!  Please see your primary doctor this week for a follow up appointment.   If you have any new, worsening, or unexpected symptoms call your doctor right away or come back to the emergency department for reevaluation.  It was my pleasure to care for you today.   Daneil Dan [place] Charon, MD

## 2024-03-15 ENCOUNTER — Encounter: Payer: Self-pay | Admitting: Family

## 2024-03-15 ENCOUNTER — Ambulatory Visit (INDEPENDENT_AMBULATORY_CARE_PROVIDER_SITE_OTHER): Admitting: Family

## 2024-03-15 ENCOUNTER — Other Ambulatory Visit: Payer: Self-pay | Admitting: Family

## 2024-03-15 VITALS — BP 110/76 | HR 75 | Temp 98.4°F | Ht 65.0 in | Wt 290.2 lb

## 2024-03-15 DIAGNOSIS — E039 Hypothyroidism, unspecified: Secondary | ICD-10-CM

## 2024-03-15 DIAGNOSIS — R7303 Prediabetes: Secondary | ICD-10-CM

## 2024-03-15 DIAGNOSIS — E063 Autoimmune thyroiditis: Secondary | ICD-10-CM

## 2024-03-15 DIAGNOSIS — L719 Rosacea, unspecified: Secondary | ICD-10-CM

## 2024-03-15 DIAGNOSIS — Z720 Tobacco use: Secondary | ICD-10-CM

## 2024-03-15 DIAGNOSIS — R7989 Other specified abnormal findings of blood chemistry: Secondary | ICD-10-CM | POA: Diagnosis not present

## 2024-03-15 DIAGNOSIS — E282 Polycystic ovarian syndrome: Secondary | ICD-10-CM

## 2024-03-15 DIAGNOSIS — E559 Vitamin D deficiency, unspecified: Secondary | ICD-10-CM

## 2024-03-15 DIAGNOSIS — E782 Mixed hyperlipidemia: Secondary | ICD-10-CM | POA: Diagnosis not present

## 2024-03-15 DIAGNOSIS — R0683 Snoring: Secondary | ICD-10-CM

## 2024-03-15 DIAGNOSIS — L409 Psoriasis, unspecified: Secondary | ICD-10-CM

## 2024-03-15 DIAGNOSIS — E785 Hyperlipidemia, unspecified: Secondary | ICD-10-CM

## 2024-03-15 DIAGNOSIS — D500 Iron deficiency anemia secondary to blood loss (chronic): Secondary | ICD-10-CM | POA: Diagnosis not present

## 2024-03-15 DIAGNOSIS — Z6841 Body Mass Index (BMI) 40.0 and over, adult: Secondary | ICD-10-CM

## 2024-03-15 LAB — CBC
HCT: 35.2 % — ABNORMAL LOW (ref 36.0–46.0)
Hemoglobin: 11.2 g/dL — ABNORMAL LOW (ref 12.0–15.0)
MCHC: 31.8 g/dL (ref 30.0–36.0)
MCV: 73.1 fl — ABNORMAL LOW (ref 78.0–100.0)
Platelets: 442 10*3/uL — ABNORMAL HIGH (ref 150.0–400.0)
RBC: 4.81 Mil/uL (ref 3.87–5.11)
RDW: 19 % — ABNORMAL HIGH (ref 11.5–15.5)
WBC: 13.7 10*3/uL — ABNORMAL HIGH (ref 4.0–10.5)

## 2024-03-15 LAB — LIPID PANEL
Cholesterol: 247 mg/dL — ABNORMAL HIGH (ref 0–200)
HDL: 51.5 mg/dL (ref >39.00)
LDL Cholesterol: 151 mg/dL — ABNORMAL HIGH (ref 0–99)
NonHDL: 195.66
Total CHOL/HDL Ratio: 5
Triglycerides: 222 mg/dL — ABNORMAL HIGH (ref 0.0–149.0)
VLDL: 44.4 mg/dL — ABNORMAL HIGH (ref 0.0–40.0)

## 2024-03-15 LAB — COMPREHENSIVE METABOLIC PANEL WITH GFR
ALT: 17 U/L (ref 0–35)
AST: 14 U/L (ref 0–37)
Albumin: 4 g/dL (ref 3.5–5.2)
Alkaline Phosphatase: 50 U/L (ref 39–117)
BUN: 14 mg/dL (ref 6–23)
CO2: 24 meq/L (ref 19–32)
Calcium: 9.8 mg/dL (ref 8.4–10.5)
Chloride: 102 meq/L (ref 96–112)
Creatinine, Ser: 0.72 mg/dL (ref 0.40–1.20)
GFR: 112 mL/min (ref >60.00)
Glucose, Bld: 79 mg/dL (ref 70–99)
Potassium: 4.2 meq/L (ref 3.5–5.1)
Sodium: 136 meq/L (ref 135–145)
Total Bilirubin: 0.5 mg/dL (ref 0.2–1.2)
Total Protein: 7.6 g/dL (ref 6.0–8.3)

## 2024-03-15 LAB — IBC + FERRITIN
Ferritin: 8.2 ng/mL — ABNORMAL LOW (ref 10.0–291.0)
Iron: 31 ug/dL — ABNORMAL LOW (ref 42–145)
Saturation Ratios: 6.5 % — ABNORMAL LOW (ref 20.0–50.0)
TIBC: 477.4 ug/dL — ABNORMAL HIGH (ref 250.0–450.0)
Transferrin: 341 mg/dL (ref 212.0–360.0)

## 2024-03-15 LAB — TSH: TSH: 0 u[IU]/mL — ABNORMAL LOW (ref 0.35–5.50)

## 2024-03-15 LAB — VITAMIN D 25 HYDROXY (VIT D DEFICIENCY, FRACTURES): VITD: 14.34 ng/mL — ABNORMAL LOW (ref 30.00–100.00)

## 2024-03-15 LAB — T4, FREE: Free T4: 1.28 ng/dL (ref 0.60–1.60)

## 2024-03-15 LAB — HEMOGLOBIN A1C: Hgb A1c MFr Bld: 5.3 % (ref 4.6–6.5)

## 2024-03-15 MED ORDER — LEVOTHYROXINE SODIUM 200 MCG PO TABS: ORAL_TABLET | ORAL | 0 refills | Status: DC

## 2024-03-15 MED ORDER — LEVOTHYROXINE SODIUM 50 MCG PO TABS: ORAL_TABLET | ORAL | 0 refills | Status: DC

## 2024-03-15 MED ORDER — CHOLECALCIFEROL 1.25 MG (50000 UT) PO TABS: 1.0000 | ORAL_TABLET | ORAL | 0 refills | Status: AC

## 2024-03-15 MED ORDER — BUPROPION HCL ER (SR) 150 MG PO TB12: 150.0000 mg | ORAL_TABLET | Freq: Two times a day (BID) | ORAL | 1 refills | Status: DC

## 2024-03-15 MED ORDER — METRONIDAZOLE 0.75 % EX LOTN: TOPICAL_LOTION | CUTANEOUS | 0 refills | Status: DC

## 2024-03-15 MED ORDER — DESONIDE 0.05 % EX CREA: TOPICAL_CREAM | Freq: Two times a day (BID) | CUTANEOUS | 0 refills | Status: DC

## 2024-03-15 NOTE — Assessment & Plan Note (Signed)
 Advised patient to work on low-cholesterol diet and exercise as tolerated.  Ordering lipid panel

## 2024-03-15 NOTE — Assessment & Plan Note (Signed)
Refill metronidazole

## 2024-03-15 NOTE — Assessment & Plan Note (Signed)
 Pt advised to work on diet and exercise as tolerated

## 2024-03-15 NOTE — Assessment & Plan Note (Deleted)
Refill metronidazole

## 2024-03-15 NOTE — Assessment & Plan Note (Signed)
 Pt advised of the following: Work on a diabetic diet, try to incorporate exercise at least 20-30 a day for 3 days a week or more.  A1c ordered pending results

## 2024-03-15 NOTE — Assessment & Plan Note (Signed)
 Continue metformin 500 mg once daily

## 2024-03-15 NOTE — Assessment & Plan Note (Signed)
 Cbc ibc ferritin  Will determine if still needed to take otc

## 2024-03-15 NOTE — Assessment & Plan Note (Signed)
 Consider sleep study will defer for now fatigue has lessened per pt

## 2024-03-15 NOTE — Assessment & Plan Note (Signed)
 Ordering tsh and free t4 today pending results Continue levothyroxine 275 mcg once daily  May need to consider endo referral if needed pending tsh labs

## 2024-03-15 NOTE — Assessment & Plan Note (Signed)
 Ordered vitamin d pending results.

## 2024-03-15 NOTE — Patient Instructions (Addendum)
  Therapy should begin at least 1 week before target quit date. Target quit dates are generally in the second week of treatment. If patient successfully quits smoking, continue treatment for at least 12 weeks.   Start wellbutrin take once daily for a week if tolerating well take twice daily.  Come up with a quit date for vaping, and be on wellbutrin at least 7-10 days and then quit.

## 2024-03-15 NOTE — Assessment & Plan Note (Signed)
 Smoking cessation instruction/counseling given:  counseled patient on the dangers of tobacco use, advised patient to stop smoking, and reviewed strategies to maximize success trial start wellbutrin can also consider nicotine patches

## 2024-03-15 NOTE — Progress Notes (Signed)
 Established Patient Office Visit  Subjective:      CC:  Chief Complaint  Patient presents with   Medical Management of Chronic Issues    HPI: Kristen Knight is a 33 y.o. female presenting on 03/15/2024 for Medical Management of Chronic Issues  Went to ER 3/29 for vaginal concerns, dx with yeast infection started three days of clotrimazole 1% cream (however she states that she was given 2%) and having great improvement however still with some itching. Still some itching and burning but very much improved.   PCOS, still taking metformin 500 mg once daily. Taking ocp as well daily.  Has changed how she is eating and working on portion control, has lost 70 pounds in the last 6 months, she is intermittent fasting. 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   Hypothyroid: taking levothyroxine 275 mcg once daily. Heterogeneous thyroid without discrete nodule, 01/06/23 last ultrasound. Does take this separate from food and other medication by at least 30 minutes. Does not take biotin.   Vaping hard for her to quit vaping, and really wants to.  She is aware she needs to quit especially being on birth control.  Does notice some depressive symptoms and thoughts.  She has an aunt that was able to quit smoking from use of wellbutrin and helped her depression as well. Tracye denies any history of seizures or anorexia or bulimia.     Wt Readings from Last 3 Encounters:  03/15/24 290 lb 3.2 oz (131.6 kg)  03/12/24 300 lb (136.1 kg)  07/13/23 (!) 359 lb 3.2 oz (162.9 kg)          Social history:  Relevant past medical, surgical, family and social history reviewed and updated as indicated. Interim medical history since our last visit reviewed.  Allergies and medications reviewed and updated.  DATA REVIEWED: CHART IN EPIC     ROS: Negative unless specifically indicated above in HPI.    Current Outpatient Medications:    albuterol  (VENTOLIN HFA) 108 (90 Base) MCG/ACT inhaler, Inhale 2 puffs into the lungs every 6 (six) hours as needed for wheezing or shortness of breath., Disp: 8 g, Rfl: 2   buPROPion (WELLBUTRIN SR) 150 MG 12 hr tablet, Take 1 tablet (150 mg total) by mouth 2 (two) times daily., Disp: 60 tablet, Rfl: 1   Cholecalciferol 1.25 MG (50000 UT) TABS, Take 1 tablet by mouth once a week., Disp: 8 tablet, Rfl: 0   clotrimazole (GYNE-LOTRIMIN) 1 % vaginal cream, Place 1 Applicatorful vaginally at bedtime for 14 days., Disp: 45 g, Rfl: 1   drospirenone-ethinyl estradiol (LORYNA) 3-0.02 MG tablet, Take 1 tablet by mouth daily., Disp: 28 tablet, Rfl: 11   levothyroxine (SYNTHROID) 200 MCG tablet, Take 1 tablet (200 mcg total) by mouth every morning., Disp: 90 tablet, Rfl: 0   Levothyroxine Sodium (TIROSINT) 75 MCG CAPS, Take 1 capsule (75 mcg total) by mouth daily before breakfast., Disp: 30 capsule, Rfl: 2   metFORMIN (GLUCOPHAGE) 500 MG tablet, Take 1 tablet (500 mg total) by mouth daily., Disp: 90 tablet, Rfl: 3   METRONIDAZOLE, TOPICAL, 0.75 % LOTN, Apply twice daily prn, Disp: 59 mL, Rfl: 0   desonide (DESOWEN) 0.05 % cream, Apply topically 2 (two) times daily. Apply topically twice daily prn on the ears, Disp: 30 g, Rfl: 0      Objective:    BP 110/76 (BP Location: Right Arm, Patient Position: Sitting, Cuff Size:  Large)   Pulse 75   Temp 98.4 F (36.9 C) (Temporal)   Ht 5\' 5"  (1.651 m)   Wt 290 lb 3.2 oz (131.6 kg)   SpO2 99%   BMI 48.29 kg/m   Wt Readings from Last 3 Encounters:  03/15/24 290 lb 3.2 oz (131.6 kg)  03/12/24 300 lb (136.1 kg)  07/13/23 (!) 359 lb 3.2 oz (162.9 kg)    Physical Exam Constitutional:      General: She is not in acute distress.    Appearance: Normal appearance. She is obese. She is not ill-appearing, toxic-appearing or diaphoretic.  HENT:     Head: Normocephalic.  Cardiovascular:     Rate and Rhythm: Normal rate and regular rhythm.  Pulmonary:     Effort: Pulmonary  effort is normal.  Musculoskeletal:        General: Normal range of motion.  Skin:    Comments: Erythema bil cheeks  Neurological:     General: No focal deficit present.     Mental Status: She is alert and oriented to person, place, and time. Mental status is at baseline.  Psychiatric:        Mood and Affect: Mood normal.        Behavior: Behavior normal.        Thought Content: Thought content normal.        Judgment: Judgment normal.           Assessment & Plan:  Vapes nicotine containing substance Assessment & Plan: Smoking cessation instruction/counseling given:  counseled patient on the dangers of tobacco use, advised patient to stop smoking, and reviewed strategies to maximize success trial start wellbutrin can also consider nicotine patches    Orders: -     buPROPion HCl ER (SR); Take 1 tablet (150 mg total) by mouth 2 (two) times daily.  Dispense: 60 tablet; Refill: 1  Mixed hyperlipidemia Assessment & Plan: Advised patient to work on low-cholesterol diet and exercise as tolerated.  Ordering lipid panel  Orders: -     Lipid panel  Acquired hypothyroidism Assessment & Plan: Ordering tsh and free t4 today pending results Continue levothyroxine 275 mcg once daily  May need to consider endo referral if needed pending tsh labs  Orders: -     T4, free  PCOS (polycystic ovarian syndrome) Assessment & Plan: Continue metformin 500 mg once daily    Vitamin D deficiency Assessment & Plan: Ordered vitamin d pending results.    Orders: -     VITAMIN D 25 Hydroxy (Vit-D Deficiency, Fractures)  Prediabetes Assessment & Plan: Pt advised of the following: Work on a diabetic diet, try to incorporate exercise at least 20-30 a day for 3 days a week or more.  A1c ordered pending results  Orders: -     Hemoglobin A1c  Iron deficiency anemia due to chronic blood loss Assessment & Plan: Cbc ibc ferritin  Will determine if still needed to take otc   Orders: -      CBC -     IBC + Ferritin; Future -     TSH  Elevated LFTs -     Comprehensive metabolic panel with GFR  Psoriasis -     Desonide; Apply topically 2 (two) times daily. Apply topically twice daily prn on the ears  Dispense: 30 g; Refill: 0  Rosacea Assessment & Plan: Refill metronidazole  Orders: -     metroNIDAZOLE; Apply twice daily prn  Dispense: 59 mL; Refill: 0  Morbid obesity  with BMI of 60.0-69.9, adult [facility]) Assessment & Plan: Pt advised to work on diet and exercise as tolerated    Snoring Assessment & Plan: Consider sleep study will defer for now fatigue has lessened per pt      Return in about 6 weeks (around 04/26/2024) for follow up vape cessation .  Mort Sawyers, MSN, APRN, FNP-C [facility]

## 2024-03-17 ENCOUNTER — Ambulatory Visit: Payer: Self-pay | Admitting: Family

## 2024-03-17 MED ORDER — LEVOTHYROXINE SODIUM 50 MCG PO CAPS: 50.0000 ug | ORAL_CAPSULE | Freq: Every day | ORAL | 0 refills | Status: DC

## 2024-03-17 MED ORDER — LEVOTHYROXINE SODIUM 200 MCG PO CAPS: ORAL_CAPSULE | ORAL | 0 refills | Status: DC

## 2024-03-17 NOTE — Addendum Note (Signed)
 Addended by: Mort Sawyers on: 03/17/2024 05:11 PM   Modules accepted: Orders

## 2024-03-21 ENCOUNTER — Encounter: Payer: Self-pay | Admitting: Family

## 2024-03-21 NOTE — Telephone Encounter (Signed)
 Can you please call pharmacy to check into the tirosint? Maybe we need a p/a? If we do could you initiate with prior auth team?  Pt failed levothyroxine

## 2024-03-23 ENCOUNTER — Other Ambulatory Visit (HOSPITAL_COMMUNITY): Payer: Self-pay

## 2024-03-23 ENCOUNTER — Telehealth: Payer: Self-pay | Admitting: Pharmacy Technician

## 2024-03-23 NOTE — Telephone Encounter (Signed)
 PA request is not needed at this time. New Encounter has been or will be created for follow up. For additional info see Pharmacy Prior Auth telephone encounter from 03/23/24.

## 2024-03-23 NOTE — Telephone Encounter (Signed)
 Pharmacy Patient Advocate Encounter  Insurance verification completed.   The patient is insured through [facility]   Ran test claim for TIROSINT 50 MCG CAPS & TIROSINT 200 MCG CAPS. Currently a quantity of 30 is a 30 day supply and the co-pay is 75.00 for both strengths . The current 30 day co-pay is, $75.00.  No PA needed at this time.  This test claim was processed through [facility]- copay amounts may vary at other pharmacies due to pharmacy/plan contracts, or as the patient moves through the different stages of their insurance plan.   Both were written for 3 month supply but unfortunately the plan will not pay for 90 day supply at the retail pharmacy

## 2024-03-30 ENCOUNTER — Other Ambulatory Visit (HOSPITAL_COMMUNITY)
Admission: RE | Admit: 2024-03-30 | Discharge: 2024-03-30 | Disposition: A | Discharge status: Home / Self Care | Source: Ambulatory Visit | Attending: Certified Nurse Midwife | Admitting: Certified Nurse Midwife

## 2024-03-30 ENCOUNTER — Encounter: Payer: Self-pay | Admitting: Certified Nurse Midwife

## 2024-03-30 ENCOUNTER — Ambulatory Visit: Admitting: Certified Nurse Midwife

## 2024-03-30 VITALS — BP 137/81 | HR 81 | Ht 64.0 in | Wt 283.0 lb

## 2024-03-30 DIAGNOSIS — N898 Other specified noninflammatory disorders of vagina: Secondary | ICD-10-CM | POA: Insufficient documentation

## 2024-03-30 DIAGNOSIS — N920 Excessive and frequent menstruation with regular cycle: Secondary | ICD-10-CM | POA: Diagnosis not present

## 2024-03-30 MED ORDER — DROSPIRENONE-ETHINYL ESTRADIOL 3-0.03 MG PO TABS: 1.0000 | ORAL_TABLET | Freq: Every day | ORAL | 11 refills | Status: DC

## 2024-03-30 NOTE — Progress Notes (Signed)
    GYNECOLOGY PROGRESS NOTE  Subjective:    Patient ID: Kristen Knight, female    DOB: [birth year], 33 y.o.   MRN: [id number]  HPI  Patient is a 33 y.o. G57P0000 female who presents for follow up on yeast infection and spotting between menses.  Patient reports use of OCPs has improved her bleeding during her cycle but she spots almost every day of the month. Currently drospirenone-ethinyl 3-0.02 mg daily.   PCOS being managed by PCP. Currently on metformin with good effects.   The following portions of the patient's history were reviewed and updated as appropriate: allergies, current medications, past family history, past medical history, past social history, past surgical history, and problem list.  Review of Systems Pertinent items are noted in HPI.   Objective:   Blood pressure 137/81, pulse 81, height 5\' 4"  (1.626 m), weight 283 lb (128.4 kg). Body mass index is 48.58 kg/m. General appearance: alert   Assessment:   1. Vagina itching   2. Spotting between menses      Plan:   1. Vagina itching (Primary) - Cervicovaginal ancillary only -TOC from ED treatment.   2.Spotting between menses -patient recently stopped vaping with help of Wellbutrin.  -Praise recent weight loss and discussed how weight reduction can be beneficial for cycle management.  -Will increase estrogen slightly to see if there is an improvement in spotting. Reviewed option for Mirena IUD which may be more effective and have a safer risk profile.  -Reviewed risks of blood clots with warning signs.

## 2024-03-31 LAB — CERVICOVAGINAL ANCILLARY ONLY
Bacterial Vaginitis (gardnerella): NEGATIVE
Candida Glabrata: NEGATIVE
Candida Vaginitis: NEGATIVE
Comment: NEGATIVE
Comment: NEGATIVE
Comment: NEGATIVE

## 2024-04-04 NOTE — Telephone Encounter (Signed)
 Patient states she has scheduled an appointment for IUD insertion. Inquiring if there is anything she needs to know/do to prepare. Advised to take Ibuprofen  600-800 mg 30 minutes to one hour prior to appointment.

## 2024-04-14 NOTE — Patient Instructions (Signed)
 IUD PLACEMENT POST-PROCEDURE INSTRUCTIONS  You may take Ibuprofen, Aleve or Tylenol for pain if needed.  Cramping should resolve within in 24 hours.  You may have a small amount of spotting.  You should wear a mini pad for the next few days.  You may have intercourse after 24 hours.  If you using this for birth control, it is effective immediately.  You need to call if you have any pelvic pain, fever, heavy bleeding or foul smelling vaginal discharge.  Irregular bleeding is common the first several months after having an IUD placed. You do not need to call for this reason unless you are concerned.  Shower or bathe as normal  You should have a follow-up appointment in 4-8 weeks for a re-check to make sure you are not having any problems.    Hormonal Birth Control (Hormonal Contraception): What to Know Hormonal birth control, also called hormonal contraception, is a type of birth control that uses chemicals called hormones to prevent pregnancy. It may include a combination of the hormones estrogen and progesterone, or only the hormone progesterone. Hormonal birth control works in these ways: It thickens the mucus in the cervix, which is the lowest part of the uterus. Thicker mucus makes it harder for sperm to get into the uterus. It changes the lining of the uterus. This makes it harder for an egg to attach or implant. It may stop the ovaries from releasing eggs, called ovulation. Some people who take hormonal birth control that contains only progesterone may continue to ovulate. Hormonal birth control doesn't protect against sexually transmitted infections (STIs). Pregnancy may still happen. Types of hormonal birth control  Estrogen and progesterone birth control Birth control that use a combination of estrogen and progesterone is available as: Pills that come in different combinations of hormones. Pills must be taken at the same time each day. They can affect your period. You can get your  period monthly, once every 3 months, or not at all. A patch that is applied to the butt, belly, upper outer arm, or back. It's kept in place for 3 weeks. It's taken off for the last or fourth week of the menstrual cycle. A vaginal ring. The ring is placed in the vagina and left there for 3 weeks. It's then taken off for the last or fourth week of the cycle. Progesterone-only birth control Birth control that uses only progesterone is available as: Pills. These should be taken at the same time every day. This is very important to decrease the chance of pregnancy. Pills containing progestin-only are usually taken every day of the cycle. Other types of pills may have an inactive pill for the last 4 days of every cycle. Intrauterine device (IUD). This device is inserted through the vagina and cervix into the uterus. It's taken out or replaced every 3 to 8 years, depending on the type. It can be taken out sooner. Implant. A plastic rod is placed under the skin of the upper arm. It is taken out or replaced every 3 years. It can be taken out sooner. Shot, also called injection. The shot is given once every 12 to 14 weeks. Risks associated with hormonal birth control Estrogen and progesterone birth control can sometimes cause side effects, such as: Feeling like you may throw up. Headaches. Breast tenderness. Bleeding or spotting between menstrual cycles. High blood pressure. This is rare. Strokes, heart attacks, or blood clots. These are rare. Progesterone-only birth control can sometimes have side effects, such as: Feeling  like you may throw up. Headaches. Breast tenderness. Irregular menstrual bleeding. High blood pressure. This is rare. Talk to your health care provider about what side effects may mean for you. Questions to ask: What type of hormonal birth control is right for me? How long should I plan to use hormonal birth control? What are the side effects of the hormonal birth control method  I choose? How can I prevent STIs while using hormonal birth control? Where to find more information Ask your provider for more information and resources about hormonal birth control. You can also go to: U.S. Department of Health and [employer], Office on Women's Health: http://hoffman.com/ This information is not intended to replace advice given to you by your health care provider. Make sure you discuss any questions you have with your health care provider. Document Revised: 06/15/2023 Document Reviewed: 06/15/2023 Elsevier Patient Education  2024 [employer].

## 2024-04-15 ENCOUNTER — Ambulatory Visit (INDEPENDENT_AMBULATORY_CARE_PROVIDER_SITE_OTHER): Admitting: Certified Nurse Midwife

## 2024-04-15 VITALS — BP 117/62 | HR 83 | Ht 64.0 in | Wt 277.3 lb

## 2024-04-15 DIAGNOSIS — Z30430 Encounter for insertion of intrauterine contraceptive device: Secondary | ICD-10-CM

## 2024-04-15 MED ORDER — LEVONORGESTREL 20 MCG/DAY IU IUD
1.0000 | INTRAUTERINE_SYSTEM | Freq: Once | INTRAUTERINE | Status: AC
  Administered 2024-04-15: 1 via INTRAUTERINE

## 2024-04-15 NOTE — Progress Notes (Signed)
   ENCOUNTER FOR IUD INSERTION   Subjective  Kristen Knight is a 33 y.o. G0P0000 who presents today for IUD insertion. She desires reversible long-term contraception. We have thoroughly reviewed the risks, benefits, and alternatives, and she has elected to proceed with Mirena insertion.   Objective BP 117/62   Pulse 83   Ht 5\' 4"  (1.626 m)   Wt 277 lb 4.8 oz (125.8 kg)   LMP  (LMP Unknown)   BMI 47.60 kg/m   UPT: declines - same sex marriage  Pelvic exam: normal external genitalia, vulva, vagina, cervix, uterus and adnexa.   Procedure Note Consent was obtained prior to the procedure. A bimanual exam was performed to determine the position of the uterus. A sterile speculum was placed in the vagina, and the cervix was visualized. Betadine was applied to the cervix. An tenaculum was placed on the anterior lip of the cervix, and gentle traction was applied to straighten and stabilize it. The uterus was sounded to about 6.5 cm. The IUD was inserted to the appropriate depth and the insertion tool was removed. The strings were trimmed to about 3 cm. Bleeding was minimal. Patient tolerated the procedure well. Post-procedure care and warning signs were reviewed with the patient  Follow up for annual visit or PRN.  Donato Fu, CNM

## 2024-04-18 ENCOUNTER — Other Ambulatory Visit

## 2024-04-25 ENCOUNTER — Other Ambulatory Visit

## 2024-05-02 ENCOUNTER — Other Ambulatory Visit

## 2024-05-09 ENCOUNTER — Encounter: Payer: Self-pay | Admitting: Family

## 2024-05-09 ENCOUNTER — Other Ambulatory Visit: Payer: Self-pay | Admitting: Family

## 2024-05-09 DIAGNOSIS — R7303 Prediabetes: Secondary | ICD-10-CM

## 2024-05-09 DIAGNOSIS — E282 Polycystic ovarian syndrome: Secondary | ICD-10-CM

## 2024-05-13 ENCOUNTER — Ambulatory Visit: Admitting: Certified Nurse Midwife

## 2024-05-16 ENCOUNTER — Ambulatory Visit (INDEPENDENT_AMBULATORY_CARE_PROVIDER_SITE_OTHER): Payer: Self-pay | Admitting: Licensed Practical Nurse

## 2024-05-16 ENCOUNTER — Other Ambulatory Visit (INDEPENDENT_AMBULATORY_CARE_PROVIDER_SITE_OTHER): Payer: Self-pay

## 2024-05-16 VITALS — BP 110/65 | HR 77 | Ht 64.0 in | Wt 274.5 lb

## 2024-05-16 DIAGNOSIS — Z30431 Encounter for routine checking of intrauterine contraceptive device: Secondary | ICD-10-CM

## 2024-05-16 DIAGNOSIS — E063 Autoimmune thyroiditis: Secondary | ICD-10-CM

## 2024-05-16 DIAGNOSIS — E785 Hyperlipidemia, unspecified: Secondary | ICD-10-CM

## 2024-05-16 NOTE — Progress Notes (Signed)
    GYNECOLOGY OFFICE ENCOUNTER NOTE  History:  33 y.o. G0P0000 here today for today for IUD string check; Mirena   IUD was placed  04/15/24. No complaints about the IUD, no concerning side effects.  For the first two weeks she had light bleeding and cramping. Now only an occasionally cramp. She has checked and felt her strings. She has not yet had penetrative sex.   The following portions of the patient's history were reviewed and updated as appropriate: allergies, current medications, past family history, past medical history, past social history, past surgical history and problem list. Last pap smear on 2024 was normal, negative HRHPV.  Review of Systems:  Pertinent items are noted in HPI.  Objective:  Physical Exam Blood pressure 110/65, pulse 77, height 5\' 4"  (1.626 m), weight 274 lb 8 oz (124.5 kg). CONSTITUTIONAL: Well-developed, well-nourished female in no acute distress.  NEUROLOGIC: Alert and oriented to person, place, and time. Normal reflexes, muscle tone coordination.  ABDOMEN: Soft, no distention noted.   PELVIC: Normal appearing external genitalia; normal appearing vaginal mucosa and cervix.  IUD strings visualized, about 2-3 cm in length outside cervix. Done in the presence of a chaperone.  EXTREMITIES: Non-tender, no edema or cyanosis  Assessment & Plan:  Patient to keep IUD in place for up to 8 years; can come in for removal if she desires pregnancy earlier or for any concerning side effects.    Anice Kerbs, CNM  [facility]  05/16/24  2:53 PM

## 2024-05-17 LAB — LIPID PANEL
Cholesterol: 232 mg/dL — ABNORMAL HIGH (ref 0–200)
HDL: 40.4 mg/dL (ref >39.00)
LDL Cholesterol: 156 mg/dL — ABNORMAL HIGH (ref 0–99)
NonHDL: 192.04
Total CHOL/HDL Ratio: 6
Triglycerides: 178 mg/dL — ABNORMAL HIGH (ref 0.0–149.0)
VLDL: 35.6 mg/dL (ref 0.0–40.0)

## 2024-05-17 LAB — TSH: TSH: 0 u[IU]/mL — ABNORMAL LOW (ref 0.35–5.50)

## 2024-05-18 ENCOUNTER — Ambulatory Visit: Payer: Self-pay | Admitting: Family

## 2024-05-31 ENCOUNTER — Encounter: Admitting: Obstetrics and Gynecology

## 2024-06-05 ENCOUNTER — Other Ambulatory Visit: Payer: Self-pay | Admitting: Family

## 2024-06-05 DIAGNOSIS — R7303 Prediabetes: Secondary | ICD-10-CM

## 2024-06-05 DIAGNOSIS — E282 Polycystic ovarian syndrome: Secondary | ICD-10-CM

## 2024-06-05 DIAGNOSIS — Z720 Tobacco use: Secondary | ICD-10-CM

## 2024-06-07 ENCOUNTER — Other Ambulatory Visit: Payer: Self-pay

## 2024-06-08 ENCOUNTER — Telehealth: Payer: Self-pay | Admitting: Family

## 2024-06-08 ENCOUNTER — Other Ambulatory Visit: Payer: Self-pay | Admitting: Family

## 2024-06-08 DIAGNOSIS — E039 Hypothyroidism, unspecified: Secondary | ICD-10-CM

## 2024-06-08 MED ORDER — LEVOTHYROXINE SODIUM 200 MCG PO CAPS: ORAL_CAPSULE | ORAL | 0 refills | Status: DC

## 2024-06-08 NOTE — Telephone Encounter (Signed)
 error

## 2024-06-09 ENCOUNTER — Ambulatory Visit: Payer: Self-pay | Admitting: Family

## 2024-06-09 ENCOUNTER — Other Ambulatory Visit: Payer: Self-pay

## 2024-06-09 ENCOUNTER — Telehealth: Payer: Self-pay | Admitting: Family

## 2024-06-09 DIAGNOSIS — E039 Hypothyroidism, unspecified: Secondary | ICD-10-CM

## 2024-06-09 LAB — TSH: TSH: 0.02 u[IU]/mL — ABNORMAL LOW (ref 0.35–5.50)

## 2024-06-09 NOTE — Telephone Encounter (Signed)
 Called patient left voicemail for her to call back to make a lab appointment to have her TSH labs done.

## 2024-06-10 MED ORDER — LEVOTHYROXINE SODIUM 175 MCG PO CAPS: 175.0000 ug | ORAL_CAPSULE | Freq: Every day | ORAL | 0 refills | Status: DC

## 2024-06-13 NOTE — Telephone Encounter (Signed)
 Called pharmacy and spoke to Fowler states that they are able to get in tab. Would like ok to change to Tab.

## 2024-06-13 NOTE — Telephone Encounter (Unsigned)
 Copied from CRM [id number]. Topic: Clinical - Prescription Issue >> Jun 13, 2024  9:58 AM Corin V wrote: Reason for CRM: [facility] pharmacy called to advise that patient cannot afford the Levothyroxine  Sodium (TIROSINT ) 175 MCG CAPS in the capsule form and asked that it be switched to tablets instead. Call back [phone] with any questions.

## 2024-06-13 NOTE — Telephone Encounter (Signed)
 Can we please call pharmacy and inquire ?  Tirosint  on our formulary only come in capsules and or solution.

## 2024-06-14 NOTE — Telephone Encounter (Signed)
 Yes please call and ok the tablets.

## 2024-06-15 NOTE — Telephone Encounter (Signed)
 Called pharmacy gave ok to make change. Will call if any question.

## 2024-07-15 ENCOUNTER — Encounter: Payer: Self-pay | Admitting: Family

## 2024-07-15 DIAGNOSIS — E039 Hypothyroidism, unspecified: Secondary | ICD-10-CM

## 2024-07-18 ENCOUNTER — Other Ambulatory Visit (INDEPENDENT_AMBULATORY_CARE_PROVIDER_SITE_OTHER): Payer: Self-pay

## 2024-07-18 DIAGNOSIS — E039 Hypothyroidism, unspecified: Secondary | ICD-10-CM

## 2024-07-18 LAB — T4, FREE: Free T4: 0.82 ng/dL (ref 0.60–1.60)

## 2024-07-18 LAB — TSH: TSH: 0.1 u[IU]/mL — ABNORMAL LOW (ref 0.35–5.50)

## 2024-07-18 LAB — T3, FREE: T3, Free: 3.1 pg/mL (ref 2.3–4.2)

## 2024-07-20 ENCOUNTER — Encounter: Payer: Self-pay | Admitting: Family

## 2024-07-22 ENCOUNTER — Encounter: Payer: Self-pay | Admitting: Family

## 2024-07-22 ENCOUNTER — Telehealth: Payer: Self-pay | Admitting: Family

## 2024-07-22 ENCOUNTER — Ambulatory Visit: Payer: Self-pay | Admitting: Family

## 2024-07-22 DIAGNOSIS — E039 Hypothyroidism, unspecified: Secondary | ICD-10-CM

## 2024-07-22 MED ORDER — LEVOTHYROXINE SODIUM 100 MCG PO CAPS: 100.0000 ug | ORAL_CAPSULE | Freq: Every day | ORAL | 1 refills | Status: DC

## 2024-07-22 MED ORDER — LEVOTHYROXINE SODIUM 125 MCG PO CAPS: 25.0000 ug | ORAL_CAPSULE | Freq: Every day | ORAL | 1 refills | Status: DC

## 2024-07-22 MED ORDER — LEVOTHYROXINE SODIUM 125 MCG PO CAPS: 125.0000 ug | ORAL_CAPSULE | Freq: Every day | ORAL | 1 refills | Status: DC

## 2024-07-22 NOTE — Telephone Encounter (Signed)
 Copied from CRM [id number]. Topic: Clinical - Medication Question >> Jul 22, 2024 11:27 AM Harlene ORN wrote: Reason for CRM: Stuart from [facility] pharmacy called. Had a levothyroxine  capsule called over with directions to take 0.2 capsules a day? Please call back the pharmacy and clarify.  Pharmacy Phone: [phone]

## 2024-07-22 NOTE — Addendum Note (Signed)
 Addended by: CORWIN ANTU on: 07/22/2024 11:42 AM   Modules accepted: Orders

## 2024-07-25 MED ORDER — LEVOTHYROXINE SODIUM 125 MCG PO TABS: 125.0000 ug | ORAL_TABLET | Freq: Every day | ORAL | 3 refills | Status: DC

## 2024-07-25 NOTE — Addendum Note (Signed)
 Addended by: CORWIN ANTU on: 07/25/2024 02:54 PM   Modules accepted: Orders

## 2024-07-26 DIAGNOSIS — Z419 Encounter for procedure for purposes other than remedying health state, unspecified: Secondary | ICD-10-CM | POA: Diagnosis not present

## 2024-07-27 ENCOUNTER — Other Ambulatory Visit: Payer: Self-pay | Admitting: Family

## 2024-07-27 DIAGNOSIS — Z720 Tobacco use: Secondary | ICD-10-CM

## 2024-08-26 DIAGNOSIS — Z419 Encounter for procedure for purposes other than remedying health state, unspecified: Secondary | ICD-10-CM | POA: Diagnosis not present

## 2024-08-29 ENCOUNTER — Other Ambulatory Visit (INDEPENDENT_AMBULATORY_CARE_PROVIDER_SITE_OTHER)

## 2024-08-29 ENCOUNTER — Ambulatory Visit: Payer: Self-pay | Admitting: Family

## 2024-08-29 DIAGNOSIS — E039 Hypothyroidism, unspecified: Secondary | ICD-10-CM

## 2024-08-29 LAB — TSH: TSH: 0.96 u[IU]/mL (ref 0.35–5.50)

## 2024-09-03 ENCOUNTER — Other Ambulatory Visit: Payer: Self-pay | Admitting: Family

## 2024-09-03 DIAGNOSIS — R7303 Prediabetes: Secondary | ICD-10-CM

## 2024-09-03 DIAGNOSIS — E282 Polycystic ovarian syndrome: Secondary | ICD-10-CM

## 2024-09-03 DIAGNOSIS — Z720 Tobacco use: Secondary | ICD-10-CM

## 2024-09-12 ENCOUNTER — Telehealth: Admitting: Physician Assistant

## 2024-09-12 DIAGNOSIS — M7989 Other specified soft tissue disorders: Secondary | ICD-10-CM | POA: Diagnosis not present

## 2024-09-12 DIAGNOSIS — M79674 Pain in right toe(s): Secondary | ICD-10-CM

## 2024-09-12 MED ORDER — PREDNISONE 10 MG (21) PO TBPK: ORAL_TABLET | ORAL | 0 refills | Status: DC

## 2024-09-12 NOTE — Progress Notes (Signed)
 Virtual Visit Consent   Kristen Knight, you are scheduled for a virtual visit with a [facility] provider today. Just as with appointments in the office, your consent must be obtained to participate. Your consent will be active for this visit and any virtual visit you may have with one of our providers in the next 365 days. If you have a MyChart account, a copy of this consent can be sent to you electronically.  As this is a virtual visit, video technology does not allow for your provider to perform a traditional examination. This may limit your provider's ability to fully assess your condition. If your provider identifies any concerns that need to be evaluated in person or the need to arrange testing (such as labs, EKG, etc.), we will make arrangements to do so. Although advances in technology are sophisticated, we cannot ensure that it will always work on either your end or our end. If the connection with a video visit is poor, the visit may have to be switched to a telephone visit. With either a video or telephone visit, we are not always able to ensure that we have a secure connection.  By engaging in this virtual visit, you consent to the provision of healthcare and authorize for your insurance to be billed (if applicable) for the services provided during this visit. Depending on your insurance coverage, you may receive a charge related to this service.  I need to obtain your verbal consent now. Are you willing to proceed with your visit today? Tashanda Fuhrer has provided verbal consent on 09/12/2024 for a virtual visit (video or telephone). Delon CHRISTELLA Dickinson, PA-C  Date: 09/12/2024 9:05 AM   Virtual Visit via Video Note   I, Delon CHRISTELLA Dickinson, connected with  Kristen Knight  ([id number], 04/11/1991) on 09/12/24 at  8:45 AM EDT by a video-enabled telemedicine application and verified that I am speaking with the correct person using two identifiers.  Location: Patient: Virtual Visit Location  Patient: Mobile Provider: Virtual Visit Location Provider: Home Office   I discussed the limitations of evaluation and management by telemedicine and the availability of in person appointments. The patient expressed understanding and agreed to proceed.    History of Present Illness: Kristen Knight is a 33 y.o. who identifies as a female who was assigned female at birth, and is being seen today for pain of right great toe.  HPI: Foot Injury  The incident occurred more than 1 week ago (2 weeks). Incident location: unknown. 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 The pain is present in the right toes (right 1st MTP joint). The quality of the pain is described as aching and stabbing. The pain is moderate. The pain has been Constant since onset. Pertinent negatives include no inability to bear weight, loss of motion, loss of sensation, numbness or tingling. Associated symptoms comments: Redness and swelling over the 1st MTP joint. She reports no foreign bodies present. The symptoms are aggravated by movement, weight bearing and palpation. She has tried acetaminophen, elevation, ice and NSAIDs (epsom salt soaks) for the symptoms. The treatment provided mild relief.     Problems:  Patient Active Problem List   Diagnosis Date Noted   Spotting between menses 03/30/2024   Vapes nicotine containing substance 03/15/2024   Prediabetes 06/26/2023   Iron deficiency anemia due to chronic blood loss 06/26/2023   Morbid obesity with BMI of 60.0-69.9, adult (HCC) 12/31/2022  Psoriasis 12/31/2022   Rosacea 12/31/2022   Hirsutism 12/31/2022   Menorrhagia with irregular cycle 12/31/2022   PCOS (polycystic ovarian syndrome) 12/31/2022   Snoring 04/03/2022   Hypothyroidism 05/13/2021   Vitamin D  deficiency 05/13/2021   Hyperlipidemia 05/13/2021    Allergies: No Known Allergies Medications:  Current Outpatient Medications:     predniSONE  (STERAPRED UNI-PAK 21 TAB) 10 MG (21) TBPK tablet, 6 day taper; take as directed on package instructions, Disp: 21 tablet, Rfl: 0   albuterol  (VENTOLIN  HFA) 108 (90 Base) MCG/ACT inhaler, Inhale 2 puffs into the lungs every 6 (six) hours as needed for wheezing or shortness of breath., Disp: 8 g, Rfl: 2   buPROPion  (WELLBUTRIN  SR) 150 MG 12 hr tablet, TAKE ONE TABLET BY MOUTH TWICE A DAY, Disp: 60 tablet, Rfl: 2   Cholecalciferol  1.25 MG (50000 UT) TABS, Take 1 tablet by mouth once a week., Disp: 12 tablet, Rfl: 0   desonide  (DESOWEN ) 0.05 % cream, Apply topically 2 (two) times daily. Apply topically twice daily prn on the ears, Disp: 30 g, Rfl: 0   levothyroxine  (SYNTHROID ) 125 MCG tablet, Take 1 tablet (125 mcg total) by mouth daily., Disp: 90 tablet, Rfl: 3   metFORMIN  (GLUCOPHAGE ) 500 MG tablet, TAKE ONE TABLET BY MOUTH ONE TIME DAILY, Disp: 90 tablet, Rfl: 1   METRONIDAZOLE , TOPICAL, 0.75 % LOTN, Apply twice daily prn, Disp: 59 mL, Rfl: 0  Observations/Objective: Patient is well-developed, well-nourished in no acute distress.  Resting comfortably Head is normocephalic, atraumatic.  No labored breathing.  Speech is clear and coherent with logical content.  Patient is alert and oriented at baseline.    Assessment and Plan: 1. 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 The patient was provided an opportunity to ask questions and all were answered. The patient agreed with the plan and demonstrated an understanding of the instructions.  A copy of instructions were sent to the patient via MyChart unless otherwise noted below.    The patient was advised to call back or seek an in-person evaluation if the symptoms worsen or if the condition fails to improve as anticipated.    Delon CHRISTELLA Dickinson, PA-C

## 2024-09-12 NOTE — Patient Instructions (Signed)
 Kristen Knight, thank you for joining Delon CHRISTELLA Dickinson, PA-C for today's virtual visit.  While this provider is not your primary care provider (PCP), if your PCP is located in our provider database this encounter information will be shared with them immediately following your visit.   A [facility] MyChart account gives you access to today's visit and all your visits, tests, and labs performed at [facility]  click here if you don't have a [facility] MyChart account or go to [email or web address]  Consent: (Patient) Kristen Knight provided verbal consent for this virtual visit at the beginning of the encounter.  Current Medications:  Current Outpatient Medications:    predniSONE  (STERAPRED UNI-PAK 21 TAB) 10 MG (21) TBPK tablet, 6 day taper; take as directed on package instructions, Disp: 21 tablet, Rfl: 0   albuterol  (VENTOLIN  HFA) 108 (90 Base) MCG/ACT inhaler, Inhale 2 puffs into the lungs every 6 (six) hours as needed for wheezing or shortness of breath., Disp: 8 g, Rfl: 2   buPROPion  (WELLBUTRIN  SR) 150 MG 12 hr tablet, TAKE ONE TABLET BY MOUTH TWICE A DAY, Disp: 60 tablet, Rfl: 2   Cholecalciferol  1.25 MG (50000 UT) TABS, Take 1 tablet by mouth once a week., Disp: 12 tablet, Rfl: 0   desonide  (DESOWEN ) 0.05 % cream, Apply topically 2 (two) times daily. Apply topically twice daily prn on the ears, Disp: 30 g, Rfl: 0   levothyroxine  (SYNTHROID ) 125 MCG tablet, Take 1 tablet (125 mcg total) by mouth daily., Disp: 90 tablet, Rfl: 3   metFORMIN  (GLUCOPHAGE ) 500 MG tablet, TAKE ONE TABLET BY MOUTH ONE TIME DAILY, Disp: 90 tablet, Rfl: 1   METRONIDAZOLE , TOPICAL, 0.75 % LOTN, Apply twice daily prn, Disp: 59 mL, Rfl: 0   Medications ordered in this encounter:  Meds ordered this encounter  Medications   predniSONE  (STERAPRED UNI-PAK 21 TAB) 10 MG (21) TBPK tablet    Sig: 6 day taper; take as directed on package instructions    Dispense:  21 tablet    Refill:  0     Supervising Provider:   BLAISE ALEENE KIDD [[id number]]     *If you need refills on other medications prior to your next appointment, please contact your pharmacy*  Follow-Up: Call back or seek an in-person evaluation if the symptoms worsen or if the condition fails to improve as anticipated.  [facility] [phone]  Other Instructions Gout  Gout is a condition that causes painful swelling of the joints. Gout is a type of inflammation of the joints (arthritis). This condition is caused by having too much uric acid in the body. Uric acid is a chemical that forms when the body breaks down substances called purines. Purines are important for building body proteins. When the body has too much uric acid, sharp crystals can form and build up inside the joints. This causes pain and swelling. Gout attacks can happen quickly and may be very painful (acute gout). Over time, the attacks can affect more joints and become more frequent (chronic gout). Gout can also cause uric acid to build up under the skin and inside the kidneys. What are the causes? This condition is caused by too much uric acid in your blood. This can happen because: Your kidneys do not remove enough uric acid from your blood. This is the most common cause. Your body makes too much uric acid. This can happen with some cancers and cancer treatments. It can also occur if your body is breaking down  too many red blood cells (hemolytic anemia). You eat too many foods that are high in purines. These foods include organ meats and some seafood. Alcohol, especially beer, is also high in purines. A gout attack may be triggered by trauma or stress. What increases the risk? The following factors may make you more likely to develop this condition: Having a family history of gout. Being female and middle-aged. Being female and having gone through menopause. Taking certain medicines, including aspirin, cyclosporine, diuretics, levodopa,  and niacin. Having an organ transplant. Having certain conditions, such as: Being obese. Lead poisoning. Kidney disease. A skin condition called psoriasis. Other factors include: Losing weight too quickly. Being dehydrated. Frequently drinking alcohol, especially beer. Frequently drinking beverages that are sweetened with a type of sugar called fructose. What are the signs or symptoms? An attack of acute gout happens quickly. It usually occurs in just one joint. The most common place is the big toe. Attacks often start at night. Other joints that may be affected include joints of the feet, ankle, knee, fingers, wrist, or elbow. Symptoms of this condition may include: Severe pain. Warmth. Swelling. Stiffness. Tenderness. The affected joint may be very painful to touch. Shiny, red, or purple skin. Chills and fever. Chronic gout may cause symptoms more frequently. More joints may be involved. You may also have white or yellow lumps (tophi) on your hands or feet or in other areas near your joints. How is this diagnosed? This condition is diagnosed based on your symptoms, your medical history, and a physical exam. You may have tests, such as: Blood tests to measure uric acid levels. Removal of joint fluid with a thin needle (aspiration) to look for uric acid crystals. X-rays to look for joint damage. How is this treated? Treatment for this condition has two phases: treating an acute attack and preventing future attacks. Acute gout treatment may include medicines to reduce pain and swelling, including: NSAIDs, such as ibuprofen . Steroids. These are strong anti-inflammatory medicines that can be taken by mouth (orally) or injected into a joint. Colchicine. This medicine relieves pain and swelling when it is taken soon after an attack. It can be given by mouth or through an IV. Preventive treatment may include: Daily use of smaller doses of NSAIDs or colchicine. Use of a medicine that  reduces uric acid levels in your blood, such as allopurinol. Changes to your diet. You may need to see a dietitian about what to eat and drink to prevent gout. Follow these instructions at home: During a gout attack  If directed, put ice on the affected area. To do this: Put ice in a plastic bag. Place a towel between your skin and the bag. Leave the ice on for 20 minutes, 2-3 times a day. Remove the ice if your skin turns bright red. This is very important. If you cannot feel pain, heat, or cold, you have a greater risk of damage to the area. Raise (elevate) the affected joint above the level of your heart as often as possible. Rest the joint as much as possible. If the affected joint is in your leg, you may be given crutches to use. Follow instructions from your health care provider about eating or drinking restrictions. Avoiding future gout attacks Follow a low-purine diet as told by your dietitian or health care provider. Avoid foods and drinks that are high in purines, including liver, kidney, anchovies, asparagus, herring, mushrooms, mussels, and beer. Maintain a healthy weight or lose weight if you are overweight.  If you want to lose weight, talk with your health care provider. Do not lose weight too quickly. Start or maintain an exercise program as told by your health care provider. Eating and drinking Avoid drinking beverages that contain fructose. Drink enough fluids to keep your urine pale yellow. If you drink alcohol: Limit how much you have to: 0-1 drink a day for women who are not pregnant. 0-2 drinks a day for men. Know how much alcohol is in a drink. In the [place] one drink equals one 12 oz bottle of beer (355 mL), one 5 oz glass of wine (148 mL), or one 1 oz glass of hard liquor (44 mL). General instructions Take over-the-counter and prescription medicines only as told by your health care provider. Ask your health care provider if the medicine prescribed to you requires  you to avoid driving or using machinery. Return to your normal activities as told by your health care provider. Ask your health care provider what activities are safe for you. Keep all follow-up visits. This is important. Where to find more information [employer]: [email or web address] Contact a health care provider if you have: Another gout attack. Continuing symptoms of a gout attack after 10 days of treatment. Side effects from your medicines. Chills or a fever. Burning pain when you urinate. Pain in your lower back or abdomen. Get help right away if you: Have severe or uncontrolled pain. Cannot urinate. Summary Gout is painful swelling of the joints caused by having too much uric acid in the body. The most common site for gout to occur is in the big toe, but it can affect other joints in the body. Medicines and dietary changes can help to prevent and treat gout attacks. This information is not intended to replace advice given to you by your health care provider. Make sure you discuss any questions you have with your health care provider. Document Revised: 09/04/2021 Document Reviewed: 09/04/2021 Elsevier Patient Education  2024 Elsevier Inc.     Turf Toe  Turf toe is a common sports injury. It affects the joint at the base of the big toe. The joint has tissues around it that help to keep it in place. Turf toe happens when the toe is bent upward by force beyond its normal limits (hyperextension). This can damage the tissue around the joint. Turf toe can be mild or more severe. Early treatment often results in good recovery. In some cases, you may still have some pain or stiffness after treatment. What are the causes? Turf toe is caused by extreme upward bending of the big toe joint. This can happen when: Your toe is pressed flat to the ground and your heel is raised while you push off with the front of your foot. This can happen when you begin a sprint. You push  off on the ball of your foot over and over while running or jumping on a hard surface. You jam your toe from a force pushing into the toe. What increases the risk? You are more likely to get turf toe if: You wear shoes that do not offer good support while running or jumping. You do activities or sports where you must run and jump on turf or hard surfaces. These activities may include: Soccer. Football. Basketball. Volleyball. Gymnastics. Dancing. Wrestling. What are the signs or symptoms? Symptoms of turf toe include: Pain at the base of your toe. Swelling at the base of your toe. Stiffness. Limited movement of your toe. Bruising. If  the turf toe was caused by a direct injury, symptoms may appear all of a sudden. If the turf toe was caused by repeated movements, the symptoms may appear slowly. How is this diagnosed? Turf toe may be diagnosed based on your symptoms, your medical history, and a physical exam. Your health care provider may check the range of motion of your toe by moving it up and down and from side to side.  You may also need tests. These may include: X-rays. MRI. How is this treated? Treatment for turf toe depends on how severe your injury is. It may include: Rest, ice, pressure (compression), and raising (elevating) your foot. This is called the RICE strategy. Limiting movement of your toe. This may be done by taping it to the smaller toes. Wearing a walking boot or a cast. Medicines. Physical therapy. Surgery. This is rare. Follow these instructions at home: If you have a nonremovable cast: Do not put pressure on any part of the cast until it is fully hardened. This may take several hours. Do not stick anything inside the cast to scratch your skin. Doing that increases your risk of infection. Check the skin around the cast every day. Tell your provider about any concerns. You may put lotion on dry skin around the edges of the cast. Do not put lotion on the skin  underneath the cast. Keep the cast clean and dry. If you have a removable boot: Wear the boot as told by your provider. Remove it only as told by your provider. Check the skin around the boot every day. Tell your provider about any concerns. Loosen the boot if your toes tingle, become numb, or turn cold and blue. Keep the boot clean and dry. Bathing If the cast or boot is not waterproof: Do not let it get wet. Cover it with a watertight covering when you take a bath or shower. Do not take baths, swim, or use a hot tub until your provider approves. Ask your provider if you may take showers. You may only be allowed to take sponge baths. Managing pain, stiffness, and swelling  If told, put ice on the injured area. If you have a removable boot, remove it as told by your provider. Put ice in a plastic bag. Place a towel between your skin and the bag. Leave the ice on for 20 minutes, 2-3 times a day. If your skin turns bright red, remove the ice right away to prevent skin damage. The risk of damage is higher if you cannot feel pain, heat, or cold. Raise (elevate) your foot above the level of your heart while you are sitting or lying down. If told, wear an elastic compression bandage. This can help prevent or lessen swelling. Do not use the injured foot to support your body weight until your provider says that you can. Use crutches as told by your provider. General instructions Take over-the-counter and prescription medicines only as told by your provider. Switch to more supportive footwear as told by your provider. Rigid shoe inserts (orthotics) may also help. Return to your normal activities as told by your provider. Ask your provider what activities are safe for you. Contact a health care provider if: You have new bruising or swelling in your toe. The pain in your toe does not get better with medicine. Your cast or boot becomes loose or damaged. Your toe joint does not feel stable or you  cannot stand on it. Get help right away if: Your pain becomes severe.  Your toe goes numb or changes color. This information is not intended to replace advice given to you by your health care provider. Make sure you discuss any questions you have with your health care provider. Document Revised: 10/07/2022 Document Reviewed: 10/07/2022 Elsevier Patient Education  2024 Elsevier Inc.   If you have been instructed to have an in-person evaluation today at a local Urgent Care facility, please use the link below. It will take you to a list of all of our available [facility], including address, phone number and hours of operation. Please do not delay care.  [facility]  If you or a family member do not have a primary care provider, use the link below to schedule a visit and establish care. When you choose a [facility] primary care physician or advanced practice provider, you gain a long-term partner in health. Find a Primary Care Provider  Learn more about [facility]'s in-office and virtual care options: [facility] - Get Care Now

## 2024-09-18 ENCOUNTER — Encounter: Payer: Self-pay | Admitting: Family

## 2024-09-18 DIAGNOSIS — M109 Gout, unspecified: Secondary | ICD-10-CM

## 2024-09-18 DIAGNOSIS — E039 Hypothyroidism, unspecified: Secondary | ICD-10-CM

## 2024-09-19 DIAGNOSIS — M109 Gout, unspecified: Secondary | ICD-10-CM | POA: Insufficient documentation

## 2024-09-27 ENCOUNTER — Encounter: Payer: Self-pay | Admitting: Family

## 2024-09-27 ENCOUNTER — Other Ambulatory Visit (INDEPENDENT_AMBULATORY_CARE_PROVIDER_SITE_OTHER)

## 2024-09-27 ENCOUNTER — Ambulatory Visit: Payer: Self-pay | Admitting: Family

## 2024-09-27 DIAGNOSIS — E039 Hypothyroidism, unspecified: Secondary | ICD-10-CM

## 2024-09-27 LAB — URIC ACID: Uric Acid, Serum: 6.8 mg/dL (ref 2.4–7.0)

## 2024-09-27 LAB — TSH: TSH: 4.5 u[IU]/mL (ref 0.35–5.50)

## 2024-09-27 MED ORDER — LEVOTHYROXINE SODIUM 112 MCG PO TABS: 112.0000 ug | ORAL_TABLET | Freq: Every day | ORAL | 1 refills | Status: DC

## 2024-09-27 NOTE — Telephone Encounter (Signed)
 I'd have to see her in office for this, last saw her in April.

## 2024-09-29 ENCOUNTER — Telehealth

## 2024-09-29 ENCOUNTER — Telehealth: Admitting: Physician Assistant

## 2024-09-29 DIAGNOSIS — M79674 Pain in right toe(s): Secondary | ICD-10-CM

## 2024-09-29 MED ORDER — PREDNISONE 20 MG PO TABS: 40.0000 mg | ORAL_TABLET | Freq: Every day | ORAL | 0 refills | Status: AC

## 2024-09-29 NOTE — Patient Instructions (Signed)
 Kristen Knight, thank you for joining Delon CHRISTELLA Dickinson, PA-C for today's virtual visit.  While this provider is not your primary care provider (PCP), if your PCP is located in our provider database this encounter information will be shared with them immediately following your visit.   A [facility] MyChart account gives you access to today's visit and all your visits, tests, and labs performed at [facility]  click here if you don't have a [facility] MyChart account or go to [email or web address]  Consent: (Patient) Kristen Knight provided verbal consent for this virtual visit at the beginning of the encounter.  Current Medications:  Current Outpatient Medications:    predniSONE  (DELTASONE ) 20 MG tablet, Take 2 tablets (40 mg total) by mouth daily with breakfast., Disp: 10 tablet, Rfl: 0   albuterol  (VENTOLIN  HFA) 108 (90 Base) MCG/ACT inhaler, Inhale 2 puffs into the lungs every 6 (six) hours as needed for wheezing or shortness of breath., Disp: 8 g, Rfl: 2   buPROPion  (WELLBUTRIN  SR) 150 MG 12 hr tablet, TAKE ONE TABLET BY MOUTH TWICE A DAY, Disp: 60 tablet, Rfl: 2   Cholecalciferol  1.25 MG (50000 UT) TABS, Take 1 tablet by mouth once a week., Disp: 12 tablet, Rfl: 0   desonide  (DESOWEN ) 0.05 % cream, Apply topically 2 (two) times daily. Apply topically twice daily prn on the ears, Disp: 30 g, Rfl: 0   levothyroxine  (SYNTHROID ) 112 MCG tablet, Take 1 tablet (112 mcg total) by mouth daily., Disp: 30 tablet, Rfl: 1   metFORMIN  (GLUCOPHAGE ) 500 MG tablet, TAKE ONE TABLET BY MOUTH ONE TIME DAILY, Disp: 90 tablet, Rfl: 1   METRONIDAZOLE , TOPICAL, 0.75 % LOTN, Apply twice daily prn, Disp: 59 mL, Rfl: 0   Medications ordered in this encounter:  Meds ordered this encounter  Medications   predniSONE  (DELTASONE ) 20 MG tablet    Sig: Take 2 tablets (40 mg total) by mouth daily with breakfast.    Dispense:  10 tablet    Refill:  0    Supervising Provider:   BLAISE ALEENE KIDD  [[id number]]     *If you need refills on other medications prior to your next appointment, please contact your pharmacy*  Follow-Up: Call back or seek an in-person evaluation if the symptoms worsen or if the condition fails to improve as anticipated.  [facility] [phone]  Other Instructions  Low-Purine Diet A low-purine diet involves making choices to limit how much purine you take in. Purine is a kind of uric acid. If you have too much uric acid in your blood, it can cause problems like gout and kidney stones. Eating a low-purine diet can help control those problems. What are tips for following this plan? Shopping Avoid buying things that have high-fructose corn syrup in them. Check for this on food labels. Be sure to check for it in: [employer], such as cookies. Canned fruits. Cereals and cereal bars. Avoid buying meats with a high purine content. These include: Veal. Chicken breast with skin. Lamb. Organ meats, such as liver. Choose dairy products. These may lower uric acid levels. Avoid buying certain types of fish. These include: Anchovies. Trout. Tuna. Sardines. Salmon. Avoid buying drinks with alcohol in them. Alcohol can affect the way your body gets rid of uric acid. Stay away from: [facility]. Hard liquor. Meal planning  Learn which foods do or don't affect you. If you find a food that causes problems, avoid eating that food. If you have any questions about a  food item, talk with your health care provider. Eat less meat. When you do eat meat, choose meats that have less purine in them. Eat lots of fruits and vegetables. Even though some vegetables have more purine in them, they don't add as much to the amount of uric acid in your blood as other foods. Have at least one serving of dairy a day. General information If you drink alcohol: Limit how much you have to: 0-1 drink a day if you're female. 0-2 drinks a day if you're female. Know how much alcohol  is in your drink. In the [place] one drink is one 12 oz bottle of beer (355 mL), one 5 oz glass of wine (148 mL), or one 1 oz glass of hard liquor (44 mL). Drink more fluids as told. Fluids can help remove uric acid from your body. Work with your provider to make a plan to help you lose weight or stay at a healthy weight. Losing weight can help lower how much uric acid is in your blood. What foods are recommended? You can have: Fresh or frozen fruits and vegetables. Whole grains, breads, cereals, and pasta. Rice. Beans, peas, and legumes. Nuts and seeds. Dairy products. Fats and oils. The items listed above may not be all the foods and drinks you can have. Talk with an expert in healthy eating called a dietitian to learn more. What foods are not recommended? Limit your intake of foods and drinks high in purines. These include: Beer and other types of alcohol. Meat-based gravy or sauce. Canned or fresh seafood, such as: Anchovies, sardines, herring, salmon, and tuna. Mussels, scallops, shrimp, and crab. Codfish, trout, and haddock. Bacon, veal, chicken breast with skin, and lamb. Organ meats, such as: Liver or kidney. Tripe. Sweetbreads (thymus gland or pancreas). [employer] officer, environmental. Yeast or yeast extract supplements. Drinks that have been made sweeter with high-fructose corn syrup. These include sodas. Processed foods made with high-fructose corn syrup. The items listed above may not be all the foods and drinks you should limit. Talk with a dietitian to learn more. This information is not intended to replace advice given to you by your health care provider. Make sure you discuss any questions you have with your health care provider. Document Revised: 03/05/2024 Document Reviewed: 03/05/2024 Elsevier Patient Education  [employer].   If you have been instructed to have an in-person evaluation today at a local Urgent Care facility, please use the link below. It will take you  to a list of all of our available [facility], including address, phone number and hours of operation. Please do not delay care.  [facility]  If you or a family member do not have a primary care provider, use the link below to schedule a visit and establish care. When you choose a [facility] primary care physician or advanced practice provider, you gain a long-term partner in health. Find a Primary Care Provider  Learn more about [facility]'s in-office and virtual care options:  - Get Care Now

## 2024-09-29 NOTE — Progress Notes (Signed)
 Virtual Visit Consent   Kristen Knight, you are scheduled for a virtual visit with a  provider today. Just as with appointments in the office, your consent must be obtained to participate. Your consent will be active for this visit and any virtual visit you may have with one of our providers in the next 365 days. If you have a MyChart account, a copy of this consent can be sent to you electronically.  As this is a virtual visit, video technology does not allow for your provider to perform a traditional examination. This may limit your provider's ability to fully assess your condition. If your provider identifies any concerns that need to be evaluated in person or the need to arrange testing (such as labs, EKG, etc.), we will make arrangements to do so. Although advances in technology are sophisticated, we cannot ensure that it will always work on either your end or our end. If the connection with a video visit is poor, the visit may have to be switched to a telephone visit. With either a video or telephone visit, we are not always able to ensure that we have a secure connection.  By engaging in this virtual visit, you consent to the provision of healthcare and authorize for your insurance to be billed (if applicable) for the services provided during this visit. Depending on your insurance coverage, you may receive a charge related to this service.  I need to obtain your verbal consent now. Are you willing to proceed with your visit today? Kristen Knight has provided verbal consent on 09/29/2024 for a virtual visit (video or telephone). Delon CHRISTELLA Dickinson, PA-C  Date: 09/29/2024 1:00 PM   Virtual Visit via Video Note   IDelon CHRISTELLA Dickinson, connected with  Kristen Knight  ([id number], 1991/09/02) on 09/29/24 at 11:45 AM EDT by a video-enabled telemedicine application and verified that I am speaking with the correct person using two identifiers.  Location: Patient: Virtual Visit Location  Patient: Mobile Provider: Virtual Visit Location Provider: Home Office   I discussed the limitations of evaluation and management by telemedicine and the availability of in person appointments. The patient expressed understanding and agreed to proceed.    History of Present Illness: Kristen Knight is a 33 y.o. who identifies as a female who was assigned female at birth, and is being seen today for continued pain in the right great toe MTP joint. Seen initially with symptoms seemed consistent with possible gout on 09/12/24. Treated with 6-day prednisone  taper.   Did have some improvements but not completely.   PCP checked labs and Uric Acid was 6.8.  Then yesterday started to have increasing pain and redness in the joint again. Has tried Ibuprofen  400mg  as needed for pain. Has also been trying dieting and avoiding high purine foods.  Problems:  Patient Active Problem List   Diagnosis Date Noted   Acute gout 09/19/2024   Spotting between menses 03/30/2024   Vapes nicotine containing substance 03/15/2024   Prediabetes 06/26/2023   Iron deficiency anemia due to chronic blood loss 06/26/2023   Morbid obesity with BMI of 60.0-69.9, adult (HCC) 12/31/2022   Psoriasis 12/31/2022   Rosacea 12/31/2022   Hirsutism 12/31/2022   Menorrhagia with irregular cycle 12/31/2022   PCOS (polycystic ovarian syndrome) 12/31/2022   Snoring 04/03/2022   Hypothyroidism 05/13/2021   Vitamin D  deficiency 05/13/2021   Hyperlipidemia 05/13/2021    Allergies: No Known Allergies Medications:  Current Outpatient Medications:    predniSONE  (DELTASONE ) 20 MG tablet,  Take 2 tablets (40 mg total) by mouth daily with breakfast., Disp: 10 tablet, Rfl: 0   albuterol  (VENTOLIN  HFA) 108 (90 Base) MCG/ACT inhaler, Inhale 2 puffs into the lungs every 6 (six) hours as needed for wheezing or shortness of breath., Disp: 8 g, Rfl: 2   buPROPion  (WELLBUTRIN  SR) 150 MG 12 hr tablet, TAKE ONE TABLET BY MOUTH TWICE A DAY, Disp: 60  tablet, Rfl: 2   Cholecalciferol  1.25 MG (50000 UT) TABS, Take 1 tablet by mouth once a week., Disp: 12 tablet, Rfl: 0   desonide  (DESOWEN ) 0.05 % cream, Apply topically 2 (two) times daily. Apply topically twice daily prn on the ears, Disp: 30 g, Rfl: 0   levothyroxine  (SYNTHROID ) 112 MCG tablet, Take 1 tablet (112 mcg total) by mouth daily., Disp: 30 tablet, Rfl: 1   metFORMIN  (GLUCOPHAGE ) 500 MG tablet, TAKE ONE TABLET BY MOUTH ONE TIME DAILY, Disp: 90 tablet, Rfl: 1   METRONIDAZOLE , TOPICAL, 0.75 % LOTN, Apply twice daily prn, Disp: 59 mL, Rfl: 0  Observations/Objective: Patient is well-developed, well-nourished in no acute distress.  Resting comfortably Head is normocephalic, atraumatic.  No labored breathing.  Speech is clear and coherent with logical content.  Patient is alert and oriented at baseline.    Assessment and Plan: 1. Great toe pain, right (Primary) - predniSONE  (DELTASONE ) 20 MG tablet; Take 2 tablets (40 mg total) by mouth daily with breakfast.  Dispense: 10 tablet; Refill: 0  - Will give a small supply of Prednisone  40mg  daily dose to continue until able to see PCP on Monday, 10/03/24. - Keep scheduled appt - Seek in person evaluation if worsens before being seen  Follow Up Instructions: I discussed the assessment and treatment plan with the patient. The patient was provided an opportunity to ask questions and all were answered. The patient agreed with the plan and demonstrated an understanding of the instructions.  A copy of instructions were sent to the patient via MyChart unless otherwise noted below.    The patient was advised to call back or seek an in-person evaluation if the symptoms worsen or if the condition fails to improve as anticipated.    Delon CHRISTELLA Dickinson, PA-C

## 2024-10-03 ENCOUNTER — Ambulatory Visit (INDEPENDENT_AMBULATORY_CARE_PROVIDER_SITE_OTHER): Admitting: Family

## 2024-10-03 VITALS — BP 128/84 | HR 70 | Temp 97.9°F | Ht 65.0 in | Wt 268.8 lb

## 2024-10-03 DIAGNOSIS — Z8739 Personal history of other diseases of the musculoskeletal system and connective tissue: Secondary | ICD-10-CM | POA: Diagnosis not present

## 2024-10-03 DIAGNOSIS — M109 Gout, unspecified: Secondary | ICD-10-CM | POA: Diagnosis not present

## 2024-10-03 DIAGNOSIS — L732 Hidradenitis suppurativa: Secondary | ICD-10-CM | POA: Insufficient documentation

## 2024-10-03 DIAGNOSIS — B3731 Acute candidiasis of vulva and vagina: Secondary | ICD-10-CM | POA: Diagnosis not present

## 2024-10-03 DIAGNOSIS — E063 Autoimmune thyroiditis: Secondary | ICD-10-CM | POA: Diagnosis not present

## 2024-10-03 MED ORDER — COLCHICINE 0.6 MG PO TABS: ORAL_TABLET | ORAL | 0 refills | Status: DC

## 2024-10-03 MED ORDER — FLUCONAZOLE 150 MG PO TABS: 150.0000 mg | ORAL_TABLET | Freq: Once | ORAL | 0 refills | Status: AC

## 2024-10-03 MED ORDER — DOXYCYCLINE HYCLATE 100 MG PO TABS: 100.0000 mg | ORAL_TABLET | Freq: Every day | ORAL | 0 refills | Status: AC

## 2024-10-03 NOTE — Progress Notes (Signed)
 Established Patient Office Visit  Subjective:      CC:  Chief Complaint  Patient presents with   Foot Pain    Was seen virtually and was told it was gout but pt does not think that it's gout.    HPI: Kristen Knight is a 33 y.o. female presenting on 10/03/2024 for Foot Pain (Was seen virtually and was told it was gout but pt does not think that it's gout.) .  Discussed the use of AI scribe software for clinical note transcription with the patient, who gave verbal consent to proceed.  History of Present Illness Kristen Knight is a 33 year old female with gout and Hashimoto's thyroiditis who presents with a gout flare and concerns about hidradenitis suppurativa.  She has been experiencing symptoms consistent with a gout flare for about two weeks prior to an e-visit. Prednisone  was prescribed, which she took for a week, followed by a week off. She recently completed a second course of prednisone , with the last dose taken today. The prednisone  initially improved her symptoms significantly, but she noticed a recurrence of the 'pulling feeling' in her toe after returning to work, with symptoms worsening daily.  She has a history of Hashimoto's thyroiditis and reports feeling better than she has in a long time, despite her thyroid  levels not being at goal. She mentions a significant weight loss of 100 pounds and changes in her diet, which she believes have contributed to her improved condition. She recalls being in a hyperthyroid state for a prolonged period and is currently on a regimen to stabilize her thyroid  levels.  She discusses her history of hidradenitis suppurativa, noting that she experiences painful flares approximately once a week, primarily in the genital area, buttocks, and occasionally the breasts. She mentions a reduction in the frequency of flares since changing her diet and losing weight. She recalls using a 'black charcoal smelling cream' in the past for flares, which was  effective but is unsure if it is still available. She is currently on metformin , which she believes has helped reduce the severity of her HS symptoms.  She expresses concern about the risk of yeast infections with antibiotic use, as she has experienced them in the past, particularly with doxycycline. She takes probiotics and uses a probiotic wash to mitigate this risk. She notes an improvement in her condition since discontinuing oral contraceptives and switching to an IUD.      Wt Readings from Last 3 Encounters:  10/03/24 268 lb 12.8 oz (121.9 kg)  05/16/24 274 lb 8 oz (124.5 kg)  04/15/24 277 lb 4.8 oz (125.8 kg)      Social history:  Relevant past medical, surgical, family and social history reviewed and updated as indicated. Interim medical history since our last visit reviewed.  Allergies and medications reviewed and updated.  DATA REVIEWED: CHART IN EPIC     ROS: Negative unless specifically indicated above in HPI.    Current Outpatient Medications:    albuterol  (VENTOLIN  HFA) 108 (90 Base) MCG/ACT inhaler, Inhale 2 puffs into the lungs every 6 (six) hours as needed for wheezing or shortness of breath., Disp: 8 g, Rfl: 2   buPROPion  (WELLBUTRIN  SR) 150 MG 12 hr tablet, TAKE ONE TABLET BY MOUTH TWICE A DAY, Disp: 60 tablet, Rfl: 2   Cholecalciferol  1.25 MG (50000 UT) TABS, Take 1 tablet by mouth once a week., Disp: 12 tablet, Rfl: 0   colchicine 0.6 MG tablet, Take two tablets day one followed by one  tablet once daily until symptoms improve prn gout flare, Disp: 20 tablet, Rfl: 0   desonide  (DESOWEN ) 0.05 % cream, Apply topically 2 (two) times daily. Apply topically twice daily prn on the ears, Disp: 30 g, Rfl: 0   doxycycline (VIBRA-TABS) 100 MG tablet, Take 1 tablet (100 mg total) by mouth daily., Disp: 90 tablet, Rfl: 0   fluconazole (DIFLUCAN) 150 MG tablet, Take 1 tablet (150 mg total) by mouth once for 1 dose., Disp: 1 tablet, Rfl: 0   levothyroxine  (SYNTHROID ) 112  MCG tablet, Take 1 tablet (112 mcg total) by mouth daily., Disp: 30 tablet, Rfl: 1   metFORMIN  (GLUCOPHAGE ) 500 MG tablet, TAKE ONE TABLET BY MOUTH ONE TIME DAILY, Disp: 90 tablet, Rfl: 1   METRONIDAZOLE , TOPICAL, 0.75 % LOTN, Apply twice daily prn, Disp: 59 mL, Rfl: 0   predniSONE  (DELTASONE ) 20 MG tablet, Take 2 tablets (40 mg total) by mouth daily with breakfast., Disp: 10 tablet, Rfl: 0        Objective:        BP 128/84 (BP Location: Left Arm, Patient Position: Sitting, Cuff Size: Large)   Pulse 70   Temp 97.9 F (36.6 C) (Temporal)   Ht 5' 5 (1.651 m)   Wt 268 lb 12.8 oz (121.9 kg)   SpO2 99%   BMI 44.73 kg/m   Physical Exam   Wt Readings from Last 3 Encounters:  10/03/24 268 lb 12.8 oz (121.9 kg)  05/16/24 274 lb 8 oz (124.5 kg)  04/15/24 277 lb 4.8 oz (125.8 kg)    Physical Exam Vitals reviewed.  Constitutional:      General: She is not in acute distress.    Appearance: Normal appearance. She is obese. She is not ill-appearing, toxic-appearing or diaphoretic.  HENT:     Head: Normocephalic.  Cardiovascular:     Rate and Rhythm: Normal rate.  Pulmonary:     Effort: Pulmonary effort is normal.  Musculoskeletal:        General: Normal range of motion.  Feet:     Comments: Right foot bunion  With slight erythema and point tenderness No swelling Neurological:     General: No focal deficit present.     Mental Status: She is alert and oriented to person, place, and time. Mental status is at baseline.  Psychiatric:        Mood and Affect: Mood normal.        Behavior: Behavior normal.        Thought Content: Thought content normal.        Judgment: Judgment normal.          Results LABS Uric Acid: within normal limits Free T4: within normal limits  Assessment & Plan:   Assessment and Plan Assessment & Plan Gout Suspected gout with symptoms responding well to prednisone . Symptoms include a pulling feeling in the toe, worsening after stopping  prednisone . Uric acid levels are normal, so no need for daily prophylactic treatment. - Prescribe colchicine with instructions to take two tablets on the first day followed by one tablet daily until symptoms improve if this is needed otherwise hang on to it for prn use - Advise to contact the office if symptoms do not improve or if another flare occurs. - Discussed that if symptoms persist, indomethacin could be considered as an alternative.  Hidradenitis suppurativa Recurrent flares approximately once a week in the genital area, buttocks, and breasts. Symptoms have improved with weight loss and dietary changes. Considering doxycycline for  management. She experiences yeast infections with antibiotics. - Prescribe doxycycline once daily for greater than three months to manage flares. - Advise to follow up with dermatology if symptoms do not improve will place referral if needed - Send Diflucan prescription to manage potential yeast infections associated with doxycycline use. - Advise to avoid taking Diflucan concurrently with colchicine.  Hashimoto's hypothyroidism Thyroid  levels are improving but not yet at goal. Previous hyperthyroid state likely due to over-suppression. Weight loss and dietary changes have contributed to improvement. - Continue current thyroid  medication regimen. - Repeat thyroid  function tests in one month to monitor levels.        Return in about 4 months (around 02/03/2025) for  f/u CPE.     Ginger Patrick, MSN, APRN, FNP-C [facility]

## 2024-10-06 ENCOUNTER — Encounter: Payer: Self-pay | Admitting: Family

## 2024-10-08 ENCOUNTER — Other Ambulatory Visit: Payer: Self-pay | Admitting: Family

## 2024-10-08 DIAGNOSIS — L719 Rosacea, unspecified: Secondary | ICD-10-CM

## 2024-10-19 ENCOUNTER — Other Ambulatory Visit: Payer: Self-pay | Admitting: Family

## 2024-10-19 DIAGNOSIS — M109 Gout, unspecified: Secondary | ICD-10-CM

## 2024-10-24 ENCOUNTER — Telehealth: Payer: Self-pay

## 2024-10-24 ENCOUNTER — Other Ambulatory Visit (HOSPITAL_COMMUNITY): Payer: Self-pay

## 2024-10-24 NOTE — Telephone Encounter (Signed)
 Pharmacy Patient Advocate Encounter   Received notification from Onbase that prior authorization for metroNIDAZOLE  0.75% LOTION  is required/requested.   Insurance verification completed.   The patient is insured through [facility] MEDICAID.   Per test claim: The current 30 day co-pay is, $7.00.  No PA needed at this time. This test claim was processed through [facility]- copay amounts may vary at other pharmacies due to pharmacy/plan contracts, or as the patient moves through the different stages of their insurance plan.

## 2024-10-26 DIAGNOSIS — Z419 Encounter for procedure for purposes other than remedying health state, unspecified: Secondary | ICD-10-CM | POA: Diagnosis not present

## 2024-11-01 ENCOUNTER — Encounter: Payer: Self-pay | Admitting: Family

## 2024-11-03 ENCOUNTER — Other Ambulatory Visit

## 2024-11-03 ENCOUNTER — Other Ambulatory Visit: Payer: Self-pay | Admitting: Family

## 2024-11-03 DIAGNOSIS — L409 Psoriasis, unspecified: Secondary | ICD-10-CM

## 2024-11-04 ENCOUNTER — Other Ambulatory Visit (INDEPENDENT_AMBULATORY_CARE_PROVIDER_SITE_OTHER)

## 2024-11-04 DIAGNOSIS — E039 Hypothyroidism, unspecified: Secondary | ICD-10-CM

## 2024-11-04 LAB — T4, FREE: Free T4: 0.46 ng/dL — ABNORMAL LOW (ref 0.60–1.60)

## 2024-11-04 LAB — TSH: TSH: 19.83 u[IU]/mL — ABNORMAL HIGH (ref 0.35–5.50)

## 2024-11-09 ENCOUNTER — Ambulatory Visit: Payer: Self-pay | Admitting: Family

## 2024-11-09 DIAGNOSIS — E063 Autoimmune thyroiditis: Secondary | ICD-10-CM

## 2024-11-15 ENCOUNTER — Encounter: Payer: Self-pay | Admitting: Family

## 2024-11-15 DIAGNOSIS — E039 Hypothyroidism, unspecified: Secondary | ICD-10-CM

## 2024-11-17 MED ORDER — SYNTHROID 125 MCG PO TABS: 125.0000 ug | ORAL_TABLET | Freq: Every day | ORAL | 1 refills | Status: DC

## 2024-11-17 NOTE — Addendum Note (Signed)
 Addended by: CORWIN ANTU on: 11/17/2024 02:27 PM   Modules accepted: Orders

## 2024-12-14 ENCOUNTER — Other Ambulatory Visit

## 2024-12-14 ENCOUNTER — Other Ambulatory Visit (INDEPENDENT_AMBULATORY_CARE_PROVIDER_SITE_OTHER)

## 2024-12-14 DIAGNOSIS — E039 Hypothyroidism, unspecified: Secondary | ICD-10-CM | POA: Diagnosis not present

## 2024-12-14 LAB — T4, FREE: Free T4: 0.73 ng/dL (ref 0.60–1.60)

## 2024-12-14 LAB — TSH: TSH: 26.53 u[IU]/mL — ABNORMAL HIGH (ref 0.35–5.50)

## 2024-12-16 ENCOUNTER — Ambulatory Visit: Payer: Self-pay | Admitting: Family

## 2024-12-16 DIAGNOSIS — E063 Autoimmune thyroiditis: Secondary | ICD-10-CM

## 2024-12-19 MED ORDER — TIROSINT-SOL 137 MCG/ML PO SOLN: 137.0000 ug | Freq: Every day | ORAL | 1 refills | Status: DC

## 2024-12-19 MED ORDER — LIOTHYRONINE SODIUM 5 MCG PO TABS: 5.0000 ug | ORAL_TABLET | Freq: Every day | ORAL | 0 refills | Status: DC

## 2024-12-21 ENCOUNTER — Other Ambulatory Visit: Payer: Self-pay | Admitting: Family

## 2024-12-21 DIAGNOSIS — L719 Rosacea, unspecified: Secondary | ICD-10-CM

## 2024-12-22 ENCOUNTER — Other Ambulatory Visit: Payer: Self-pay | Admitting: Family

## 2024-12-22 DIAGNOSIS — B3731 Acute candidiasis of vulva and vagina: Secondary | ICD-10-CM

## 2024-12-23 ENCOUNTER — Other Ambulatory Visit: Payer: Self-pay | Admitting: Family

## 2024-12-23 ENCOUNTER — Ambulatory Visit: Payer: Self-pay | Admitting: Family

## 2024-12-23 DIAGNOSIS — L719 Rosacea, unspecified: Secondary | ICD-10-CM

## 2024-12-23 DIAGNOSIS — N898 Other specified noninflammatory disorders of vagina: Secondary | ICD-10-CM

## 2024-12-23 DIAGNOSIS — B3731 Acute candidiasis of vulva and vagina: Secondary | ICD-10-CM

## 2024-12-23 MED ORDER — LIOTHYRONINE SODIUM 5 MCG PO TABS: 5.0000 ug | ORAL_TABLET | Freq: Every day | ORAL | 0 refills | Status: AC

## 2024-12-23 MED ORDER — TIROSINT-SOL 137 MCG/ML PO SOLN: 137.0000 ug | Freq: Every day | ORAL | 1 refills | Status: AC

## 2024-12-23 NOTE — Addendum Note (Signed)
 Addended by: CORWIN ANTU on: 12/23/2024 03:14 PM   Modules accepted: Orders

## 2024-12-23 NOTE — Telephone Encounter (Signed)
 FYI Only or Action Required?: Action required by provider: medication refill request.  Patient was last seen in primary care on 10/03/2024 by Corwin Antu, FNP.  Called Nurse Triage reporting Vaginitis.  Symptoms began several days ago.  Interventions attempted: Nothing.  Symptoms are: unchanged.  Triage Disposition: See Physician Within 24 Hours  Patient/caregiver understands and will follow disposition?: Yes  Copied from CRM #[id number]. Topic: Clinical - Red Word Triage >> Dec 23, 2024 11:18 AM Jayma L wrote: Red Word that prompted transfer to Nurse Triage:  itching and discomfort with urination Reason for Disposition  [1] Rash (e.g., redness, tiny bumps, sore) of genital area AND [2] present > 24 hours  Answer Assessment - Initial Assessment Questions [facility]  Patient is wondering why her metroNIDAZOLE  0.75 % refill got denied when its a medication she uses daily for rosacea. Also still requesting yeast infection med. No appointments found for today. Patient told she should go to UC. 1. DISCHARGE: Describe the discharge. (e.g., white, yellow, green, gray, foamy, cottage cheese-like)     Denies discharge, 2. ODOR: Is there a bad odor?     Denies 3. ONSET: When did the discharge begin?     2-3 days ago 4. RASH: Is there a rash in the genital area? If Yes, ask: Describe it. (e.g., redness, blisters, sores, bumps)     Slight rash 5. ABDOMEN PAIN: Are you having any abdomen pain? If Yes, ask: What does it feel like?  (e.g., crampy, dull, intermittent, constant)      Denies 7. CAUSE: What do you think is causing the discharge? Have you had the same problem before? What happened then?     Gets yeast inferctions after periods sometimes  8. OTHER SYMPTOMS: Do you have any other symptoms? (e.g., fever, itching, urination pain, vaginal bleeding, vaginal foreign body)     Itching, painful urination 9. PREGNANCY: Is there any chance you are pregnant?  When was your last menstrual period?     Denies  Answer Assessment - Initial Assessment Questions 1. DISCHARGE: Describe the discharge. (e.g., white, yellow, green, gray, foamy, cottage cheese-like)     Denies discharge, 2. ODOR: Is there a bad odor?     *No Answer* 3. ONSET: When did the discharge begin?     2-3 days ago 4. RASH: Is there a rash in the genital area? If Yes, ask: Describe it. (e.g., redness, blisters, sores, bumps)     *No Answer* 5. ABDOMEN PAIN: Are you having any abdomen pain? If Yes, ask: What does it feel like?  (e.g., crampy, dull, intermittent, constant)      *No Answer* 6. ABDOMEN PAIN SEVERITY: If present, ask: How bad is it? (e.g., Scale 1-10; mild, moderate, or severe)     *No Answer* 7. CAUSE: What do you think is causing the discharge? Have you had the same problem before? What happened then?     *No Answer* 8. OTHER SYMPTOMS: Do you have any other symptoms? (e.g., fever, itching, urination pain, vaginal bleeding, vaginal foreign body)     *No Answer* 9. PREGNANCY: Is there any chance you are pregnant? When was your last menstrual period?     *No Answer*  Protocols used: Vaginal Discharge-A-AH, Vaginal Symptoms-A-AH

## 2024-12-26 ENCOUNTER — Encounter: Payer: Self-pay | Admitting: Family

## 2024-12-26 ENCOUNTER — Encounter

## 2024-12-27 MED ORDER — FLUCONAZOLE 150 MG PO TABS: ORAL_TABLET | ORAL | 0 refills | Status: DC

## 2024-12-27 MED ORDER — FLUCONAZOLE 150 MG PO TABS: ORAL_TABLET | ORAL | 0 refills | Status: AC

## 2024-12-27 MED ORDER — METRONIDAZOLE 0.75 % EX LOTN: TOPICAL_LOTION | CUTANEOUS | 1 refills | Status: AC

## 2024-12-27 MED ORDER — METRONIDAZOLE 0.75 % EX LOTN: TOPICAL_LOTION | CUTANEOUS | 1 refills | Status: DC

## 2024-12-27 NOTE — Addendum Note (Signed)
 Addended by: CORWIN ANTU on: 12/27/2024 08:54 AM   Modules accepted: Orders

## 2024-12-27 NOTE — Addendum Note (Signed)
 Addended by: CORWIN ANTU on: 12/27/2024 08:33 AM   Modules accepted: Orders

## 2025-01-20 ENCOUNTER — Other Ambulatory Visit

## 2025-01-20 DIAGNOSIS — E063 Autoimmune thyroiditis: Secondary | ICD-10-CM

## 2025-01-20 LAB — T4, FREE: Free T4: 0.76 ng/dL (ref 0.60–1.60)

## 2025-01-20 LAB — TSH: TSH: 0.77 u[IU]/mL (ref 0.35–5.50)

## 2025-01-20 LAB — T3, FREE: T3, Free: 3.5 pg/mL (ref 2.3–4.2)

## 2025-02-03 ENCOUNTER — Other Ambulatory Visit

## 2025-02-06 ENCOUNTER — Ambulatory Visit: Admitting: "Endocrinology
